# Patient Record
Sex: Female | Born: 1975 | Race: White | Hispanic: No | Marital: Single | Smoking: Current some day smoker
Health system: Southern US, Community
[De-identification: ages and names within clinical notes are randomized; demographics above are authoritative.]

## PROBLEM LIST (undated history)

## (undated) DIAGNOSIS — T7840XA Allergy, unspecified, initial encounter: Secondary | ICD-10-CM

## (undated) DIAGNOSIS — E785 Hyperlipidemia, unspecified: Secondary | ICD-10-CM

## (undated) DIAGNOSIS — F419 Anxiety disorder, unspecified: Secondary | ICD-10-CM

## (undated) DIAGNOSIS — G4730 Sleep apnea, unspecified: Secondary | ICD-10-CM

## (undated) DIAGNOSIS — I10 Essential (primary) hypertension: Secondary | ICD-10-CM

## (undated) DIAGNOSIS — D689 Coagulation defect, unspecified: Secondary | ICD-10-CM

## (undated) DIAGNOSIS — D649 Anemia, unspecified: Secondary | ICD-10-CM

## (undated) DIAGNOSIS — F329 Major depressive disorder, single episode, unspecified: Secondary | ICD-10-CM

## (undated) HISTORY — DX: Essential (primary) hypertension: I10

## (undated) HISTORY — DX: Anemia, unspecified: D64.9

## (undated) HISTORY — DX: Major depressive disorder, single episode, unspecified: F32.9

## (undated) HISTORY — DX: Allergy, unspecified, initial encounter: T78.40XA

## (undated) HISTORY — DX: Coagulation defect, unspecified: D68.9

## (undated) HISTORY — DX: Anxiety disorder, unspecified: F41.9

## (undated) HISTORY — PX: SPINAL FUSION: SHX223

## (undated) HISTORY — DX: Sleep apnea, unspecified: G47.30

## (undated) HISTORY — DX: Hyperlipidemia, unspecified: E78.5

---

## 2004-01-27 HISTORY — PX: CHOLECYSTECTOMY: SHX55

## 2006-09-19 ENCOUNTER — Ambulatory Visit: Payer: Self-pay | Admitting: Emergency Medicine

## 2007-03-17 ENCOUNTER — Observation Stay: Payer: Self-pay | Admitting: Obstetrics and Gynecology

## 2007-06-08 ENCOUNTER — Ambulatory Visit: Payer: Self-pay | Admitting: Internal Medicine

## 2007-06-08 ENCOUNTER — Emergency Department: Payer: Self-pay | Admitting: Emergency Medicine

## 2007-06-24 ENCOUNTER — Emergency Department: Payer: Self-pay | Admitting: Emergency Medicine

## 2007-10-09 ENCOUNTER — Ambulatory Visit: Payer: Self-pay | Admitting: Internal Medicine

## 2008-06-04 ENCOUNTER — Ambulatory Visit: Payer: Self-pay | Admitting: Internal Medicine

## 2011-03-20 DIAGNOSIS — F4310 Post-traumatic stress disorder, unspecified: Secondary | ICD-10-CM | POA: Insufficient documentation

## 2011-03-20 DIAGNOSIS — F1321 Sedative, hypnotic or anxiolytic dependence, in remission: Secondary | ICD-10-CM | POA: Insufficient documentation

## 2011-03-20 DIAGNOSIS — F1021 Alcohol dependence, in remission: Secondary | ICD-10-CM | POA: Insufficient documentation

## 2011-05-26 DIAGNOSIS — M5020 Other cervical disc displacement, unspecified cervical region: Secondary | ICD-10-CM | POA: Insufficient documentation

## 2011-11-18 DIAGNOSIS — N920 Excessive and frequent menstruation with regular cycle: Secondary | ICD-10-CM | POA: Insufficient documentation

## 2011-11-18 DIAGNOSIS — E663 Overweight: Secondary | ICD-10-CM | POA: Insufficient documentation

## 2011-11-18 DIAGNOSIS — F489 Nonpsychotic mental disorder, unspecified: Secondary | ICD-10-CM | POA: Insufficient documentation

## 2011-12-04 DIAGNOSIS — S9000XA Contusion of unspecified ankle, initial encounter: Secondary | ICD-10-CM | POA: Insufficient documentation

## 2012-06-30 DIAGNOSIS — N921 Excessive and frequent menstruation with irregular cycle: Secondary | ICD-10-CM | POA: Insufficient documentation

## 2012-06-30 DIAGNOSIS — N939 Abnormal uterine and vaginal bleeding, unspecified: Secondary | ICD-10-CM | POA: Insufficient documentation

## 2013-02-28 DIAGNOSIS — O3680X0 Pregnancy with inconclusive fetal viability, not applicable or unspecified: Secondary | ICD-10-CM | POA: Insufficient documentation

## 2013-03-03 DIAGNOSIS — O021 Missed abortion: Secondary | ICD-10-CM | POA: Insufficient documentation

## 2013-09-29 ENCOUNTER — Inpatient Hospital Stay: Payer: Self-pay

## 2013-09-29 LAB — CBC WITH DIFFERENTIAL/PLATELET
BASOS PCT: 0.8 %
Basophil #: 0.1 10*3/uL (ref 0.0–0.1)
EOS PCT: 2.8 %
Eosinophil #: 0.2 10*3/uL (ref 0.0–0.7)
HCT: 32.6 % — AB (ref 35.0–47.0)
HGB: 11.1 g/dL — AB (ref 12.0–16.0)
Lymphocyte #: 1.3 10*3/uL (ref 1.0–3.6)
Lymphocyte %: 17.2 %
MCH: 33 pg (ref 26.0–34.0)
MCHC: 34.1 g/dL (ref 32.0–36.0)
MCV: 97 fL (ref 80–100)
MONO ABS: 0.6 x10 3/mm (ref 0.2–0.9)
Monocyte %: 7.4 %
NEUTROS PCT: 71.8 %
Neutrophil #: 5.4 10*3/uL (ref 1.4–6.5)
PLATELETS: 230 10*3/uL (ref 150–440)
RBC: 3.36 10*6/uL — ABNORMAL LOW (ref 3.80–5.20)
RDW: 12.6 % (ref 11.5–14.5)
WBC: 7.5 10*3/uL (ref 3.6–11.0)

## 2013-09-29 LAB — BASIC METABOLIC PANEL
Anion Gap: 9 (ref 7–16)
BUN: 4 mg/dL — AB (ref 7–18)
CALCIUM: 8.7 mg/dL (ref 8.5–10.1)
CO2: 21 mmol/L (ref 21–32)
CREATININE: 0.4 mg/dL — AB (ref 0.60–1.30)
Chloride: 108 mmol/L — ABNORMAL HIGH (ref 98–107)
EGFR (African American): >60
EGFR (Non-African Amer.): >60
Glucose: 77 mg/dL (ref 65–99)
Osmolality: 271 (ref 275–301)
POTASSIUM: 3.4 mmol/L — AB (ref 3.5–5.1)
Sodium: 138 mmol/L (ref 136–145)

## 2013-10-27 ENCOUNTER — Ambulatory Visit: Payer: Self-pay | Admitting: Obstetrics & Gynecology

## 2014-01-19 ENCOUNTER — Observation Stay: Payer: Self-pay | Admitting: Obstetrics and Gynecology

## 2014-01-26 HISTORY — PX: CERVICAL CONE BIOPSY: SUR198

## 2014-01-30 ENCOUNTER — Inpatient Hospital Stay: Payer: Self-pay

## 2014-01-30 LAB — CBC WITH DIFFERENTIAL/PLATELET
Basophil #: 0.1 10*3/uL (ref 0.0–0.1)
Basophil %: 0.6 %
Eosinophil #: 0.2 10*3/uL (ref 0.0–0.7)
Eosinophil %: 2 %
HCT: 30.8 % — ABNORMAL LOW (ref 35.0–47.0)
HGB: 10.7 g/dL — ABNORMAL LOW (ref 12.0–16.0)
LYMPHS ABS: 1.6 10*3/uL (ref 1.0–3.6)
Lymphocyte %: 18.8 %
MCH: 32 pg (ref 26.0–34.0)
MCHC: 34.6 g/dL (ref 32.0–36.0)
MCV: 93 fL (ref 80–100)
MONO ABS: 0.7 x10 3/mm (ref 0.2–0.9)
MONOS PCT: 8.3 %
NEUTROS ABS: 6 10*3/uL (ref 1.4–6.5)
Neutrophil %: 70.3 %
PLATELETS: 250 10*3/uL (ref 150–440)
RBC: 3.33 10*6/uL — ABNORMAL LOW (ref 3.80–5.20)
RDW: 13.7 % (ref 11.5–14.5)
WBC: 8.5 10*3/uL (ref 3.6–11.0)

## 2014-01-31 LAB — HEMATOCRIT: HCT: 21.8 % — ABNORMAL LOW (ref 35.0–47.0)

## 2014-05-03 ENCOUNTER — Ambulatory Visit
Admit: 2014-05-03 | Disposition: A | Discharge status: Home / Self Care | Payer: Self-pay | Attending: Obstetrics and Gynecology | Admitting: Obstetrics and Gynecology

## 2014-05-21 LAB — SURGICAL PATHOLOGY

## 2014-05-27 NOTE — Op Note (Signed)
PATIENT NAME:  Lauren Cox, Lauren Cox MR#:  [id number] DATE OF BIRTH:  September 12, 1975  DATE OF PROCEDURE:  05/03/2014  PREOPERATIVE DIAGNOSIS:  Cervical intraepithelial neoplasia, grade 3.   POSTOPERATIVE DIAGNOSIS:  Cervical intraepithelial neoplasia, grade 3  OPERATION PERFORMED:  Cold knife cone biopsy of the cervix.  ANESTHESIA:  General.   PRIMARY SURGEON:  Florina OuAndreas M. Bonney AidStaebler, MD   PREOPERATIVE ANTIBIOTICS:  None.   DRAINS OR TUBES:  None. Surgicel dressing.   ESTIMATED BLOOD LOSS:  5 mL.   OPERATIVE FLUIDS:  500 mL.   URINE OUTPUT:  50 mL.   COMPLICATIONS:  None.   FINDINGS:  Grossly normal-appearing parous ectocervix. A stay suture of 0 Vicryl at the 3 and 9 o'clock position was placed during the case. Surgicel dressing was placed 12 O'Clock portion on specimen tagged with a suture.   CONDITION FOLLOWING PROCEDURE:  Stable.   PROCEDURE IN DETAIL:  Risks, benefits, and alternatives of the procedure were discussed with the patient prior to proceeding with the case. The patient was taken to the operating room and placed under general endotracheal anesthesia. She was positioned in the dorsal lithotomy position using allen stirups and prepped and drapped in the usual sterile fashion. A timeout was performed. The bladder was catheterized with a red rubber catheter. The anterior lip of the cervix was grasped with a single-tooth tenaculum. The cervix was deflected to the patient's left and the first stay suture of 0 Vicryl was placed in the 9 O'Clock position of the cervix. The cervix was then deflected to patients right and the second stay suture of 0 vicryl was placed at the 3 o'clock position. Both stay sutures were placed at the cervicovaginal junction. Following placement of the stay sutures, the cervix was infiltrated with 19 mL of 1% lidocaine with epinephrine. Following this the outline of the cone specimen was scored using and 11 blade scalpel.  A uterine sound was used to mark the direction  of the cervical canal, and the cone specimen was taken by cutting from the previously scorred margin toward the sound. Following removal of the cone specimen an ECC was performed to obtain the top margin of the biopsy. The bed was then cauterized using electrocautery. Monsel solution was applied and a Surgicel dressing was kept in place by tying the previously placed stay sutures together. The cervix was inspected and noted to be hemostatic. Sponge, needle, and instrument counts were correct x 2. The patient tolerated the procedure well and was taken to the recovery room in stable condition.     ____________________________ Florina OuAndreas M. Bonney AidStaebler, MD ams:nb D: 05/03/2014 21:38:25 ET T: 05/03/2014 22:30:16 ET JOB#: [id number]  cc: Florina OuAndreas M. Bonney AidStaebler, MD, <Dictator> Lorrene ReidANDREAS M Franciszek Platten MD ELECTRONICALLY SIGNED 05/16/2014 22:44
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
## 2015-04-11 DIAGNOSIS — B029 Zoster without complications: Secondary | ICD-10-CM | POA: Insufficient documentation

## 2016-05-18 ENCOUNTER — Ambulatory Visit: Payer: Self-pay | Admitting: Obstetrics and Gynecology

## 2016-05-25 ENCOUNTER — Ambulatory Visit (INDEPENDENT_AMBULATORY_CARE_PROVIDER_SITE_OTHER): Payer: Medicaid Other | Admitting: Obstetrics and Gynecology

## 2016-05-25 ENCOUNTER — Encounter: Payer: Self-pay | Admitting: Obstetrics and Gynecology

## 2016-05-25 VITALS — BP 138/80 | HR 82 | Ht 62.0 in | Wt 173.0 lb

## 2016-05-25 DIAGNOSIS — F411 Generalized anxiety disorder: Secondary | ICD-10-CM | POA: Diagnosis not present

## 2016-05-25 DIAGNOSIS — N946 Dysmenorrhea, unspecified: Secondary | ICD-10-CM

## 2016-05-25 DIAGNOSIS — N920 Excessive and frequent menstruation with regular cycle: Secondary | ICD-10-CM

## 2016-05-25 MED ORDER — NORETHINDRONE ACETATE 5 MG PO TABS: 5.0000 mg | ORAL_TABLET | Freq: Every day | ORAL | 11 refills | Status: DC

## 2016-05-25 MED ORDER — ESCITALOPRAM OXALATE 10 MG PO TABS: 10.0000 mg | ORAL_TABLET | Freq: Every day | ORAL | 2 refills | Status: DC

## 2016-05-25 MED ORDER — HYDROXYZINE HCL 25 MG PO TABS: 25.0000 mg | ORAL_TABLET | Freq: Four times a day (QID) | ORAL | 2 refills | Status: DC | PRN

## 2016-05-25 NOTE — Patient Instructions (Signed)
Generalized Anxiety Disorder, Adult Generalized anxiety disorder (GAD) is a mental health disorder. People with this condition constantly worry about everyday events. Unlike normal anxiety, worry related to GAD is not triggered by a specific event. These worries also do not fade or get better with time. GAD interferes with life functions, including relationships, work, and school. GAD can vary from mild to severe. People with severe GAD can have intense waves of anxiety with physical symptoms (panic attacks). What are the causes? The exact cause of GAD is not known. What increases the risk? This condition is more likely to develop in:  Women.  People who have a family history of anxiety disorders.  People who are very shy.  People who experience very stressful life events, such as the death of a loved one.  People who have a very stressful family environment. What are the signs or symptoms? People with GAD often worry excessively about many things in their lives, such as their health and family. They may also be overly concerned about:  Doing well at work.  Being on time.  Natural disasters.  Friendships. Physical symptoms of GAD include:  Fatigue.  Muscle tension or having muscle twitches.  Trembling or feeling shaky.  Being easily startled.  Feeling like your heart is pounding or racing.  Feeling out of breath or like you cannot take a deep breath.  Having trouble falling asleep or staying asleep.  Sweating.  Nausea, diarrhea, or irritable bowel syndrome (IBS).  Headaches.  Trouble concentrating or remembering facts.  Restlessness.  Irritability. How is this diagnosed? Your health care provider can diagnose GAD based on your symptoms and medical history. You will also have a physical exam. The health care provider will ask specific questions about your symptoms, including how severe they are, when they started, and if they come and go. Your health care  provider may ask you about your use of alcohol or drugs, including prescription medicines. Your health care provider may refer you to a mental health specialist for further evaluation. Your health care provider will do a thorough examination and may perform additional tests to rule out other possible causes of your symptoms. To be diagnosed with GAD, a person must have anxiety that:  Is out of his or her control.  Affects several different aspects of his or her life, such as work and relationships.  Causes distress that makes him or her unable to take part in normal activities.  Includes at least three physical symptoms of GAD, such as restlessness, fatigue, trouble concentrating, irritability, muscle tension, or sleep problems. Before your health care provider can confirm a diagnosis of GAD, these symptoms must be present more days than they are not, and they must last for six months or longer. How is this treated? The following therapies are usually used to treat GAD:  Medicine. Antidepressant medicine is usually prescribed for long-term daily control. Antianxiety medicines may be added in severe cases, especially when panic attacks occur.  Talk therapy (psychotherapy). Certain types of talk therapy can be helpful in treating GAD by providing support, education, and guidance. Options include:  Cognitive behavioral therapy (CBT). People learn coping skills and techniques to ease their anxiety. They learn to identify unrealistic or negative thoughts and behaviors and to replace them with positive ones.  Acceptance and commitment therapy (ACT). This treatment teaches people how to be mindful as a way to cope with unwanted thoughts and feelings.  Biofeedback. This process trains you to manage your body's response (  physiological response) through breathing techniques and relaxation methods. You will work with a therapist while machines are used to monitor your physical symptoms.  Stress  management techniques. These include yoga, meditation, and exercise. A mental health specialist can help determine which treatment is best for you. Some people see improvement with one type of therapy. However, other people require a combination of therapies. Follow these instructions at home:  Take over-the-counter and prescription medicines only as told by your health care provider.  Try to maintain a normal routine.  Try to anticipate stressful situations and allow extra time to manage them.  Practice any stress management or self-calming techniques as taught by your health care provider.  Do not punish yourself for setbacks or for not making progress.  Try to recognize your accomplishments, even if they are small.  Keep all follow-up visits as told by your health care provider. This is important. Contact a health care provider if:  Your symptoms do not get better.  Your symptoms get worse.  You have signs of depression, such as:  A persistently sad, cranky, or irritable mood.  Loss of enjoyment in activities that used to bring you joy.  Change in weight or eating.  Changes in sleeping habits.  Avoiding friends or family members.  Loss of energy for normal tasks.  Feelings of guilt or worthlessness. Get help right away if:  You have serious thoughts about hurting yourself or others. If you ever feel like you may hurt yourself or others, or have thoughts about taking your own life, get help right away. You can go to your nearest emergency department or call:  Your local emergency services (911 in the [place]  A suicide crisis helpline, such as the National Suicide Prevention Lifeline at 1-800-273-8255. This is open 24 hours a day. Summary  Generalized anxiety disorder (GAD) is a mental health disorder that involves worry that is not triggered by a specific event.  People with GAD often worry excessively about many things in their lives, such as their health and  family.  GAD may cause physical symptoms such as restlessness, trouble concentrating, sleep problems, frequent sweating, nausea, diarrhea, headaches, and trembling or muscle twitching.  A mental health specialist can help determine which treatment is best for you. Some people see improvement with one type of therapy. However, other people require a combination of therapies. This information is not intended to replace advice given to you by your health care provider. Make sure you discuss any questions you have with your health care provider. Document Released: 05/09/2012 Document Revised: 12/03/2015 Document Reviewed: 12/03/2015 Elsevier Interactive Patient Education  2017 Elsevier Inc.  

## 2016-05-25 NOTE — Progress Notes (Signed)
Obstetrics & Gynecology Office Visit   Chief Complaint:  Chief Complaint  Patient presents with  . Vaginal Bleeding    painful/ heavy bleeding/moody    History of Present Illness: The patient is a 41 y.o. female presenting initial evaluation for symptoms of anxiety.  The patient is currently taking prozac  for the management of her symptoms.  She has not had any recent situational stressors.  She reports symptoms of insomnia, irritability and social anxiety.  She denies anhedonia, day time somnolence, risk taking behavior, increased appetite, decreased appetite, agorophobia, feelings of guilt, feelings of worthlessness, suicidal ideation, homicidal ideation, auditory hallucinations and visual hallucinations. Symptoms have improved since intial symptom onset.     The patient does have a pre-existing history of depression and anxiety.  She  does not a prior history of suicide attempts.  Previous treatment tied include Prozac.     Review of Systems: Review of Systems  Psychiatric/Behavioral: Negative for depression, hallucinations, memory loss, substance abuse and suicidal ideas. The patient is nervous/anxious and has insomnia.     Past Medical History:  Past Medical History:  Diagnosis Date  . Anxiety     Past Surgical History:  Past Surgical History:  Procedure Laterality Date  . CERVICAL CONE BIOPSY  2016  . CHOLECYSTECTOMY  2006  . SPINAL FUSION      Gynecologic History: Patient's last menstrual period was 05/23/2016 (exact date).  Obstetric History: [id number]  Family History:  History reviewed. No pertinent family history.  Social History:  Social History   Social History  . Marital status: Married    Spouse name: N/A  . Number of children: N/A  . Years of education: N/A   Occupational History  . Not on file.   Social History Main Topics  . Smoking status: Current Every Day Smoker    Packs/day: 1.00    Types: Cigarettes  . Smokeless tobacco: Never  Used  . Alcohol use Yes  . Drug use: No  . Sexual activity: Yes    Partners: Male    Birth control/ protection: Pill   Other Topics Concern  . Not on file   Social History Narrative  . No narrative on file    Allergies:  Allergies  Allergen Reactions  . Sulfa Antibiotics Anaphylaxis  . Fish-Derived Products     Other reaction(s): SWELLING  . Penicillins Rash    Medications: Prior to Admission medications   Medication Sig Start Date End Date Taking? Authorizing Provider  EPINEPHrine 0.3 mg/0.3 mL IJ SOAJ injection Inject 0.3 mg into the muscle. 07/23/12  Yes Historical Provider, MD  FLUoxetine (PROZAC) 20 MG capsule Take 20 mg by mouth. 04/23/16 04/23/17 Yes Historical Provider, MD  levonorgestrel-ethinyl estradiol (AVIANE,ALESSE,LESSINA) 0.1-20 MG-MCG tablet Take one active pill daily, skip placebo pills, to suppress periods 01/01/16  Yes Historical Provider, MD    Physical Exam Vitals:  Vitals:   05/25/16 0919  BP: 138/80  Pulse: 82   Patient's last menstrual period was 05/23/2016 (exact date).  General: NAD HEENT: normocephalic, anicteric Pulmonary: No increased work of breathing Cardiovascular: RRR, distal pulses 2+ Abdomen: NABS, soft, non-tender, non-distended.  Umbilicus without lesions.  No hepatomegaly, splenomegaly or masses palpable. No evidence of hernia  Genitourinary:  External: Normal external female genitalia.  Normal urethral meatus, normal  Bartholin's and Skene's glands.    Vagina: Normal vaginal mucosa, no evidence of prolapse.    Cervix: Grossly normal in appearance, no bleeding  Uterus: Non-enlarged, mobile, normal contour.  No CMT  Adnexa: ovaries non-enlarged, no adnexal masses  Rectal: deferred  Lymphatic: no evidence of inguinal lymphadenopathy Extremities: no edema, erythema, or tenderness Neurologic: Grossly intact Psychiatric: mood appropriate, affect full  Female chaperone present for pelvic and breast  portions of the physical  exam  Assessment: 41 y.o. [id number] menorrhagia, dysmenorrhea, history of CKC for CIN II  Plan: Problem List Items Addressed This Visit    None     - switch to norethindrone differential includes adenomysosis and endometriosis.  Smoker - [facility] at [facility] and here previously normal - Pap up to date at [facility] lexapro and prn vistaril, discussed if maxed out on lexapro will add buspar.  GAD-7 is 19

## 2016-06-16 ENCOUNTER — Encounter: Payer: Self-pay | Admitting: Obstetrics and Gynecology

## 2016-08-21 ENCOUNTER — Ambulatory Visit: Payer: Medicaid Other | Admitting: Obstetrics and Gynecology

## 2016-09-14 ENCOUNTER — Emergency Department
Admission: EM | Admit: 2016-09-14 | Discharge: 2016-09-14 | Disposition: A | Discharge status: Home / Self Care | Payer: Self-pay | Attending: Emergency Medicine | Admitting: Emergency Medicine

## 2016-09-14 ENCOUNTER — Encounter: Payer: Self-pay | Admitting: Medical Oncology

## 2016-09-14 DIAGNOSIS — R112 Nausea with vomiting, unspecified: Secondary | ICD-10-CM

## 2016-09-14 DIAGNOSIS — S098XXA Other specified injuries of head, initial encounter: Secondary | ICD-10-CM | POA: Insufficient documentation

## 2016-09-14 DIAGNOSIS — Z79899 Other long term (current) drug therapy: Secondary | ICD-10-CM | POA: Insufficient documentation

## 2016-09-14 DIAGNOSIS — Y999 Unspecified external cause status: Secondary | ICD-10-CM | POA: Insufficient documentation

## 2016-09-14 DIAGNOSIS — W19XXXA Unspecified fall, initial encounter: Secondary | ICD-10-CM | POA: Insufficient documentation

## 2016-09-14 DIAGNOSIS — Y92511 Restaurant or cafe as the place of occurrence of the external cause: Secondary | ICD-10-CM | POA: Insufficient documentation

## 2016-09-14 DIAGNOSIS — E860 Dehydration: Secondary | ICD-10-CM | POA: Insufficient documentation

## 2016-09-14 DIAGNOSIS — F17210 Nicotine dependence, cigarettes, uncomplicated: Secondary | ICD-10-CM | POA: Insufficient documentation

## 2016-09-14 DIAGNOSIS — Y939 Activity, unspecified: Secondary | ICD-10-CM | POA: Insufficient documentation

## 2016-09-14 LAB — CBC
HCT: 43.6 % (ref 35.0–47.0)
HEMOGLOBIN: 15 g/dL (ref 12.0–16.0)
MCH: 32.9 pg (ref 26.0–34.0)
MCHC: 34.4 g/dL (ref 32.0–36.0)
MCV: 95.6 fL (ref 80.0–100.0)
PLATELETS: 295 10*3/uL (ref 150–440)
RBC: 4.56 MIL/uL (ref 3.80–5.20)
RDW: 12.2 % (ref 11.5–14.5)
WBC: 8.2 10*3/uL (ref 3.6–11.0)

## 2016-09-14 LAB — BASIC METABOLIC PANEL
Anion gap: 8 (ref 5–15)
BUN: 12 mg/dL (ref 6–20)
CALCIUM: 9.6 mg/dL (ref 8.9–10.3)
CO2: 27 mmol/L (ref 22–32)
Chloride: 105 mmol/L (ref 101–111)
Creatinine, Ser: 0.81 mg/dL (ref 0.44–1.00)
GLUCOSE: 87 mg/dL (ref 65–99)
Potassium: 3.8 mmol/L (ref 3.5–5.1)
Sodium: 140 mmol/L (ref 135–145)

## 2016-09-14 MED ORDER — SODIUM CHLORIDE 0.9 % IV BOLUS (SEPSIS)
1000.0000 mL | Freq: Once | INTRAVENOUS | Status: AC
  Administered 2016-09-14: 1000 mL via INTRAVENOUS

## 2016-09-14 MED ORDER — ONDANSETRON HCL 4 MG/2ML IJ SOLN
4.0000 mg | Freq: Once | INTRAMUSCULAR | Status: AC
  Administered 2016-09-14: 4 mg via INTRAVENOUS
  Filled 2016-09-14: qty 2

## 2016-09-14 MED ORDER — ONDANSETRON 4 MG PO TBDP: 4.0000 mg | ORAL_TABLET | Freq: Three times a day (TID) | ORAL | 0 refills | Status: DC | PRN

## 2016-09-14 NOTE — ED Triage Notes (Signed)
Pt reports she was at [employer] eating when she went outside and felt like she was going to pass out, pt reports she vomited x 1 then began to see white and fell to the ground. Pt states that she fell striking the rt side of her forehead. Pt appears very uncomfortable. Pt states pain around her entire rib cage that feels like a Belt is constricting her from breathing. Pt diaphoretic in triage. Dry blood noted to rt side of head.

## 2016-09-14 NOTE — ED Notes (Signed)
Patient states unable to void at this time. IVF running, will notify me when able. Skin warm and dry and pink.

## 2016-09-14 NOTE — ED Provider Notes (Signed)
[facility] Emergency Department Provider Note  ____________________________________________  Time seen: Approximately 7:55 PM  I have reviewed the triage vital signs and the nursing notes.   HISTORY  Chief Complaint Loss of Consciousness and Chest Pain    HPI Lauren Cox is a 41 y.o. female who is in her usual state of health, when she went to [employer] for lunch. 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 She went outside where she vomited once. She felt sweaty and lightheaded, and thinks that she fell onto the ground hitting her head. May have had brief loss of consciousness. At some point she called her significant other who arrived and found her sitting in the car. Patient reports that currently she just feels lightheaded when she sits up or stands. She does have a history of getting dehydrated and orthostatic, and reports that she has baseline anemia. She's been compliant with her medications.  Denies any chest pain or shortness of breath currently. No nausea or vomiting. Abdominal pain.     Past Medical History:  Diagnosis Date  . Anxiety      There are no active problems to display for this patient.    Past Surgical History:  Procedure Laterality Date  . CERVICAL CONE BIOPSY  2016  . CHOLECYSTECTOMY  2006  . SPINAL FUSION       Prior to Admission medications   Medication Sig Start Date End Date Taking? Authorizing Provider  EPINEPHrine 0.3 mg/0.3 mL IJ SOAJ injection Inject 0.3 mg into the muscle. 07/23/12   [provider]  escitalopram (LEXAPRO) 10 MG tablet Take 1 tablet (10 mg total) by mouth daily. 05/25/16 05/25/17  Vena Austria, MD  hydrOXYzine (ATARAX/VISTARIL) 25 MG tablet Take 1 tablet (25 mg total) by mouth every 6 (six) hours as needed for anxiety or  itching. 05/25/16   Vena Austria, MD  norethindrone (AYGESTIN) 5 MG tablet Take 1 tablet (5 mg total) by mouth daily. 05/25/16   Vena Austria, MD  ondansetron (ZOFRAN ODT) 4 MG disintegrating tablet Take 1 tablet (4 mg total) by mouth every 8 (eight) hours as needed for nausea or vomiting. 09/14/16   Sharman Cheek, MD     Allergies Sulfa antibiotics; Fish-derived products; and Penicillins   No family history on file.  Social History Social History  Substance Use Topics  . Smoking status: Current Every Day Smoker    Packs/day: 1.00    Types: Cigarettes  . Smokeless tobacco: Never Used  . Alcohol use Yes    Review of Systems  Constitutional:   No fever or chills.  ENT:   No sore throat. No rhinorrhea. Cardiovascular:   No chest pain or syncope. Respiratory:   No dyspnea or cough. Gastrointestinal:   Positive for brief episode of abdominal pain and vomiting. No diarrhea or constipation.  Musculoskeletal:   Negative for focal pain or swelling All other systems reviewed and are negative except as documented above in ROS and HPI.  ____________________________________________   PHYSICAL EXAM:  VITAL SIGNS: ED Triage Vitals  Enc Vitals Group     BP 09/14/16 1750 (!) 159/79     Pulse Rate 09/14/16 1750 98     Resp 09/14/16 1750 18     Temp 09/14/16 1750 98.4 F (36.9 C)     Temp Source 09/14/16 1750 Oral  SpO2 09/14/16 1750 100 %     Weight 09/14/16 1750 150 lb (68 kg)     Height 09/14/16 1750 5\' 1"  (1.549 m)     Head Circumference --      Peak Flow --      Pain Score 09/14/16 1749 4     Pain Loc --      Pain Edu? --      Excl. in GC? --     Vital signs reviewed, nursing assessments reviewed.   Constitutional:   Alert and oriented. Well appearing and in no distress. Eyes:   No scleral icterus.  EOMI. No nystagmus. No conjunctival pallor. PERRL. ENT   Head:   Normocephalic and atraumatic.   Nose:   No congestion/rhinnorhea.     Mouth/Throat:   Dry mucous membranes, no pharyngeal erythema. No peritonsillar mass.    Neck:   No meningismus. Full ROM Hematological/Lymphatic/Immunilogical:   No cervical lymphadenopathy. Cardiovascular:   RRR. Symmetric bilateral radial and DP pulses.  No murmurs.  Respiratory:   Normal respiratory effort without tachypnea/retractions. Breath sounds are clear and equal bilaterally. No wheezes/rales/rhonchi. Gastrointestinal:   Soft and nontender. Non distended. There is no CVA tenderness.  No rebound, rigidity, or guarding. Genitourinary:   deferred Musculoskeletal:   Normal range of motion in all extremities. No joint effusions.  No lower extremity tenderness.  No edema. Neurologic:   Normal speech and language.  Motor grossly intact. No gross focal neurologic deficits are appreciated.  Skin:    Skin is warm, dry and intact. No rash noted.  No petechiae, purpura, or bullae.  ____________________________________________    LABS (pertinent positives/negatives) (all labs ordered are listed, but only abnormal results are displayed) Labs Reviewed  BASIC METABOLIC PANEL  CBC  CBG MONITORING, ED   ____________________________________________   EKG  Interpreted by me Normal sinus rhythm rate of 79, rightward axis, normal intervals. Normal QRS ST segments and T waves. No evidence of right heart strain.  ____________________________________________    RADIOLOGY  No results found.  ____________________________________________   PROCEDURES Procedures  ____________________________________________   INITIAL IMPRESSION / ASSESSMENT AND PLAN / ED COURSE  Pertinent labs & imaging results that were available during my care of the patient were reviewed by me and considered in my medical decision making (see chart for details).  Patient well appearing no acute distress, presents with orthostatic symptoms, likely dehydrated versus food borne illness such as preformed staph  toxin from the meatloaf she ate at [employer] today. Also suffered a mild concussion related to her fall and head injury. Low suspicion for ACS PE dissection AAA ectopic torsion or surgical abdomen. Vital signs are normal, labs are unremarkable, exam is benign. Patient given IV fluids and Zofran, feeling better and tolerating oral intake. Discharge home to follow up with primary care. I think her hemoglobin of 15 today's hemoconcentration given her reported chronic anemia. No evidence of sepsis or acute infection.      ____________________________________________   FINAL CLINICAL IMPRESSION(S) / ED DIAGNOSES  Final diagnoses:  Dehydration  Nausea and vomiting, intractability of vomiting not specified, unspecified vomiting type  Blunt head trauma, initial encounter      New Prescriptions   ONDANSETRON (ZOFRAN ODT) 4 MG DISINTEGRATING TABLET    Take 1 tablet (4 mg total) by mouth every 8 (eight) hours as needed for nausea or vomiting.     Portions of this note were generated with dragon dictation software. Dictation errors may occur despite best attempts at  proofreading.    Sharman Cheek, MD 09/14/16 [phone]

## 2016-09-26 DIAGNOSIS — N912 Amenorrhea, unspecified: Secondary | ICD-10-CM | POA: Insufficient documentation

## 2016-12-09 ENCOUNTER — Telehealth: Payer: Self-pay | Admitting: Pharmacy Technician

## 2016-12-09 NOTE — Telephone Encounter (Signed)
[facility] filled initial prescription.  Patient was given new patient packet.  Patient never returned information or scheduled and eligibility appointment.  [facility] unable to provide additional medication assistance until eligibility is determined.  Lauren Cox Lauren Cox Care Manager [facility] 

## 2017-07-02 ENCOUNTER — Ambulatory Visit (INDEPENDENT_AMBULATORY_CARE_PROVIDER_SITE_OTHER): Payer: Self-pay | Admitting: Obstetrics and Gynecology

## 2017-07-02 ENCOUNTER — Encounter: Payer: Self-pay | Admitting: Obstetrics and Gynecology

## 2017-07-02 VITALS — HR 84 | Ht 61.0 in | Wt 155.0 lb

## 2017-07-02 DIAGNOSIS — Z1231 Encounter for screening mammogram for malignant neoplasm of breast: Secondary | ICD-10-CM

## 2017-07-02 DIAGNOSIS — Z113 Encounter for screening for infections with a predominantly sexual mode of transmission: Secondary | ICD-10-CM

## 2017-07-02 DIAGNOSIS — Z124 Encounter for screening for malignant neoplasm of cervix: Secondary | ICD-10-CM

## 2017-07-02 DIAGNOSIS — N882 Stricture and stenosis of cervix uteri: Secondary | ICD-10-CM

## 2017-07-02 DIAGNOSIS — Z1239 Encounter for other screening for malignant neoplasm of breast: Secondary | ICD-10-CM

## 2017-07-02 DIAGNOSIS — N939 Abnormal uterine and vaginal bleeding, unspecified: Secondary | ICD-10-CM

## 2017-07-02 MED ORDER — NORETHINDRONE ACETATE 5 MG PO TABS: 5.0000 mg | ORAL_TABLET | Freq: Every day | ORAL | 11 refills | Status: DC

## 2017-07-02 MED ORDER — CITALOPRAM HYDROBROMIDE 20 MG PO TABS: 20.0000 mg | ORAL_TABLET | Freq: Every day | ORAL | 11 refills | Status: DC

## 2017-07-02 NOTE — Progress Notes (Signed)
Gynecology Abnormal Uterine Bleeding Initial Evaluation   Chief Complaint:  Chief Complaint  Patient presents with  . Menstrual Problem    Ireegular bleeding, heavy periods, severe cramping     History of Present Illness:    [name] is a 42 y.o. [id number] who LMP was No LMP recorded. (Menstrual status: Irregular Periods)., presents today for a problem visit.  She complains of menorrhagia that  began several months ago and its severity is described as moderate.  She has irregular menses with duration up to 3 weeks with onset, moderate menstrual cramping.  She has used the following for attempts at control: none currently but previously well controlled on norethindrone. The patient is not currently sexually active.   Prior Diagnosis: AUB-O secondary to PCOS. Previous Treatment: norethindrone.  She does have a history of cervical dysplasia but no follow up pap smears since the procedure for CIN III.  Review of Systems: Review of Systems  Constitutional: Negative for chills and fever.  HENT: Negative for congestion.   Respiratory: Negative for cough and shortness of breath.   Cardiovascular: Negative for chest pain and palpitations.  Gastrointestinal: Negative for abdominal pain, constipation, diarrhea, heartburn, nausea and vomiting.  Genitourinary: Negative for dysuria, frequency and urgency.  Skin: Negative for itching and rash.  Neurological: Negative for dizziness and headaches.  Endo/Heme/Allergies: Negative for polydipsia.  Psychiatric/Behavioral: Negative for depression.    Past Medical History:  Past Medical History:  Diagnosis Date  . Anxiety     Past Surgical History:  Past Surgical History:  Procedure Laterality Date  . CERVICAL CONE BIOPSY  2016  . CHOLECYSTECTOMY  2006  . SPINAL FUSION      Obstetric History: [id number]  Family History:  History reviewed. No pertinent family history.  Social History:  Social History   Socioeconomic History  . Marital  status: Divorced    Spouse name: Not on file  . Number of children: Not on file  . Years of education: Not on file  . Highest education level: Not on file  Occupational History  . Not on file  Social Needs  . Financial resource strain: Not on file  . Food insecurity:    Worry: Not on file    Inability: Not on file  . Transportation needs:    Medical: Not on file    Non-medical: Not on file  Tobacco Use  . Smoking status: Current Every Day Smoker    Packs/day: 1.00    Types: Cigarettes  . Smokeless tobacco: Never Used  Substance and Sexual Activity  . Alcohol use: Yes  . Drug use: No  . Sexual activity: Yes    Partners: Male    Birth control/protection: None  Lifestyle  . Physical activity:    Days per week: Not on file    Minutes per session: Not on file  . Stress: Not on file  Relationships  . Social connections:    Talks on phone: Not on file    Gets together: Not on file    Attends religious service: Not on file    Active member of club or organization: Not on file    Attends meetings of clubs or organizations: Not on file    Relationship status: Not on file  . Intimate partner violence:    Fear of current or ex partner: Not on file    Emotionally abused: Not on file    Physically abused: Not on file    Forced sexual activity: Not on  file  Other Topics Concern  . Not on file  Social History Narrative  . Not on file    Allergies:  Allergies  Allergen Reactions  . Sulfa Antibiotics Anaphylaxis  . Fish-Derived Products     Other reaction(s): SWELLING  . Penicillins Rash    Medications: Prior to Admission medications   Medication Sig Start Date End Date Taking? Authorizing Provider  Multiple Vitamin (MULTIVITAMIN) capsule Take 1 capsule by mouth daily.   Yes [provider]  EPINEPHrine 0.3 mg/0.3 mL IJ SOAJ injection Inject 0.3 mg into the muscle. 07/23/12   [provider]  escitalopram (LEXAPRO) 10 MG tablet Take 1 tablet (10 mg  total) by mouth daily. 05/25/16 05/25/17  Vena Austria, MD  hydrOXYzine (ATARAX/VISTARIL) 25 MG tablet Take 1 tablet (25 mg total) by mouth every 6 (six) hours as needed for anxiety or itching. Patient not taking: Reported on 07/02/2017 05/25/16   Vena Austria, MD  norethindrone (AYGESTIN) 5 MG tablet Take 1 tablet (5 mg total) by mouth daily. Patient not taking: Reported on 07/02/2017 05/25/16   Vena Austria, MD    Physical Exam Pulse 84, height 5\' 1"  (1.549 m), weight 155 lb (70.3 kg).  No LMP recorded. (Menstrual status: Irregular Periods).  General: NAD HEENT: normocephalic, anicteric Pulmonary: No increased work of breathing Genitourinary:  External: Normal external female genitalia.  Normal urethral meatus, normal Bartholin's and Skene's glands.    Vagina: Normal vaginal mucosa, no evidence of prolapse.    Cervix: Grossly normal in appearance, no bleeding  Uterus: Non-enlarged, mobile, normal contour.  No CMT  Adnexa: ovaries non-enlarged, no adnexal masses  Rectal: deferred  Lymphatic: no evidence of inguinal lymphadenopathy Extremities: no edema, erythema, or tenderness Neurologic: Grossly intact Psychiatric: mood appropriate, affect full  Female chaperone present for pelvic portions of the physical exam    ENDOMETRIAL BIOPSY     The indications for endometrial biopsy were reviewed.   Risks of the biopsy including cramping, bleeding, infection, uterine perforation, inadequate specimen and need for additional procedures  were discussed. The patient states she understands and agrees to undergo procedure today. Consent was signed. Time out was performed. Urine HCG was negative. A Graves speculum was placed and the cervix was brought into view.  The cervix was prepped with Betadine. A single-toothed tenaculum was  placed on the anterior lip of the cervix for traction. A 3 mm pipelle was introduced through the cervix but unable to pass into the endometrial cavity likely  secondary to cervical scarring and stenosis from her prior LEEP. The instruments were removed from the patient's vagina. Minimal bleeding from the cervix was noted. The patient tolerated the procedure well. Routine post-procedure instructions were given to the patient.    Assessment: 42 y.o. [id number] with abnormal uterine bleeding  Plan: Problem List Items Addressed This Visit    None    Visit Diagnoses    Abnormal uterine bleeding    -  Primary   Relevant Orders   PapIG, HPV, rfx 16/18   US Transvaginal Non-OB   Screening for malignant neoplasm of cervix       Relevant Orders   PapIG, HPV, rfx 16/18   Routine screening for STI (sexually transmitted infection)       Breast screening       Relevant Orders   MM DIGITAL SCREENING BILATERAL   Cervical stenosis (uterine cervix)          1) Discussed management options for abnormal uterine bleeding including expectant, NSAIDs,  tranexamic acid (Lysteda), oral progesterone (Provera, norethindrone, megace), Depo Provera, Levonorgestrel containing IUD, endometrial ablation (Novasure) or hysterectomy as definitive surgical management.  Discussed risks and benefits of each method.   Final management decision will hinge on results of patient's work up and whether an underlying etiology for the patients bleeding symptoms can be discerned.  We will conduct a basic work up examining using the PALM-COIEN classification system.  In the meantime the patient opts to trial norethindrone while we await results of her ultrasound and labs.  The role of unopposed estrogen in the development of endometrial hyperplasia or carcinoma is discussed.  The risk of endometrial hyperplasia is linearly correlated with increasing BMI given the production of estrone by adipose tissue. Printed patient education handouts were given to the patient to review at home.  Bleeding precautions reviewed.  - pap and endometrial biopsy  Unable to obtain endometrial biopsy secondary to  cervical stenosis - mammogram ordered - Restart norethindrone refill lexparo (self pay switch to citalopram) - ultrasound as unable to obtained EMBx  2) Return in about 2 weeks (around 07/16/2017) for US and follow up.  Vena AustriaAndreas Nivin Braniff, MD, Evern CoreFACOG [facility], [facility] 07/02/2017, 8:42 AM

## 2017-07-02 NOTE — Patient Instructions (Signed)
Behavior Health Resources   [facility] cash pay discounts  [facility] samples at walk in clinic

## 2017-07-07 LAB — PAPIG, HPV, RFX 16/18
HPV, high-risk: NEGATIVE
PAP Smear Comment: 0

## 2017-07-13 ENCOUNTER — Telehealth: Payer: Self-pay

## 2017-07-13 ENCOUNTER — Telehealth: Payer: Self-pay | Admitting: Obstetrics and Gynecology

## 2017-07-13 NOTE — Telephone Encounter (Signed)
Pt would like clarification of her pap results.  [phone]

## 2017-07-13 NOTE — Telephone Encounter (Signed)
Called and spoke with Patient about scheduling Colpo. Patient would like to  Come in for Ultrasound first before scheduling Colpo. Patient is self pay and [facility]

## 2017-07-13 NOTE — Telephone Encounter (Signed)
Pap results are: ASCUS/Neg HPV. Please advise patient

## 2017-07-13 NOTE — Telephone Encounter (Signed)
-----   Message from Vena AustriaAndreas Staebler, MD sent at 07/13/2017 12:06 PM EDT ----- Should have ultrasound scheduled in the next week but can we also add on a colposcopy given her pap results

## 2017-07-17 ENCOUNTER — Other Ambulatory Visit: Payer: Self-pay | Admitting: Obstetrics and Gynecology

## 2017-07-17 DIAGNOSIS — D061 Carcinoma in situ of exocervix: Secondary | ICD-10-CM

## 2017-07-17 DIAGNOSIS — R87610 Atypical squamous cells of undetermined significance on cytologic smear of cervix (ASC-US): Secondary | ICD-10-CM

## 2017-07-20 ENCOUNTER — Ambulatory Visit: Payer: Self-pay | Admitting: Obstetrics and Gynecology

## 2017-07-20 ENCOUNTER — Encounter: Payer: Self-pay | Admitting: Obstetrics and Gynecology

## 2017-07-20 ENCOUNTER — Ambulatory Visit (INDEPENDENT_AMBULATORY_CARE_PROVIDER_SITE_OTHER): Payer: Self-pay

## 2017-07-20 VITALS — BP 132/78 | HR 78 | Ht 62.0 in | Wt 156.0 lb

## 2017-07-20 DIAGNOSIS — N939 Abnormal uterine and vaginal bleeding, unspecified: Secondary | ICD-10-CM

## 2017-07-20 DIAGNOSIS — D069 Carcinoma in situ of cervix, unspecified: Secondary | ICD-10-CM

## 2017-07-20 DIAGNOSIS — N946 Dysmenorrhea, unspecified: Secondary | ICD-10-CM

## 2017-07-20 DIAGNOSIS — R87610 Atypical squamous cells of undetermined significance on cytologic smear of cervix (ASC-US): Secondary | ICD-10-CM

## 2017-07-20 NOTE — Progress Notes (Signed)
Gynecology Ultrasound Follow Up  Chief Complaint:  Chief Complaint  Patient presents with  . Follow-up    GYN U/S     History of Present Illness: Patient is a 42 y.o. female who presents today for ultrasound evaluation of abnormal uterine bleeding and dysmenorrhea.  Ultrasound demonstrates the following findgins Adnexanormal ovaries Uterus: Non-enlarged with endometrial stripe suspicious for endometriosis Additional: no free fluid.  Seward CarolSunjay Clarita LeberWomble is a 42 y.o. woman who presents today for continued surveillance for history of dysplasia. Last pap obtained on 07/02/2017 revealed ASCUS HPV negative.  Prior CKC CIN III  05/03/2014  Review of Systems: ROS  Past Medical History:  Past Medical History:  Diagnosis Date  . Anxiety     Past Surgical History:  Past Surgical History:  Procedure Laterality Date  . CERVICAL CONE BIOPSY  2016  . CHOLECYSTECTOMY  2006  . SPINAL FUSION      Gynecologic History:  No LMP recorded. (Menstrual status: Irregular Periods).  Family History:  No family history on file.  Social History:  Social History   Socioeconomic History  . Marital status: Divorced    Spouse name: Not on file  . Number of children: Not on file  . Years of education: Not on file  . Highest education level: Not on file  Occupational History  . Not on file  Social Needs  . Financial resource strain: Not on file  . Food insecurity:    Worry: Not on file    Inability: Not on file  . Transportation needs:    Medical: Not on file    Non-medical: Not on file  Tobacco Use  . Smoking status: Current Every Day Smoker    Packs/day: 1.00    Types: Cigarettes  . Smokeless tobacco: Never Used  Substance and Sexual Activity  . Alcohol use: Yes  . Drug use: No  . Sexual activity: Yes    Partners: Male    Birth control/protection: None  Lifestyle  . Physical activity:    Days per week: Not on file    Minutes per session: Not on file  . Stress: Not on file    Relationships  . Social connections:    Talks on phone: Not on file    Gets together: Not on file    Attends religious service: Not on file    Active member of club or organization: Not on file    Attends meetings of clubs or organizations: Not on file    Relationship status: Not on file  . Intimate partner violence:    Fear of current or ex partner: Not on file    Emotionally abused: Not on file    Physically abused: Not on file    Forced sexual activity: Not on file  Other Topics Concern  . Not on file  Social History Narrative  . Not on file    Allergies:  Allergies  Allergen Reactions  . Sulfa Antibiotics Anaphylaxis  . Fish-Derived Products     Other reaction(s): SWELLING  . Penicillins Rash    Medications: Prior to Admission medications   Medication Sig Start Date End Date Taking? Authorizing Provider  citalopram (CELEXA) 20 MG tablet Take 1 tablet (20 mg total) by mouth daily. 07/02/17   Vena AustriaStaebler, Jannine Abreu, MD  EPINEPHrine 0.3 mg/0.3 mL IJ SOAJ injection Inject 0.3 mg into the muscle. 07/23/12   [provider]  hydrOXYzine (ATARAX/VISTARIL) 25 MG tablet Take 1 tablet (25 mg total) by mouth every 6 (six)  hours as needed for anxiety or itching. Patient not taking: Reported on 07/02/2017 05/25/16   Vena Austria, MD  Multiple Vitamin (MULTIVITAMIN) capsule Take 1 capsule by mouth daily.    [provider]  norethindrone (AYGESTIN) 5 MG tablet Take 1 tablet (5 mg total) by mouth daily. 07/02/17   Vena Austria, MD    Physical Exam Vitals: There were no vitals taken for this visit.  General: NAD HEENT: normocephalic, anicteric Pulmonary: No increased work of breathing Extremities: no edema, erythema, or tenderness Neurologic: Grossly intact, normal gait Psychiatric: mood appropriate, affect full    Assessment: 42 y.o. [id number] No problem-specific Assessment & Plan notes found for this encounter.   Plan: Problem List Items Addressed This  Visit    None    Visit Diagnoses    ASCUS of cervix with negative high risk HPV    -  Primary   CIN III with severe dysplasia       Abnormal uterine bleeding       Dysmenorrhea          1) Follow up pap following CKC for CIN III - ASCUS HPV negative.  Per ASCCP guidelines I would obtain a colposcopy but as lapse in insurance has been referred to [facility] with appointment pending  2) AUB and dysmenorrhea - ultrasound today consistent with adenomyosis.  I think symptoms are further compounded by partial outlet obstruction secondary to cervical stenosis following CKC.  I discussed cervical dilation and endometrial sampling in OR with [facility].  Will await results of [facility] referral to verify no additional cervical procedures are warranted concomitantly. - continue norethindrone for cycle suppression has done well with this in the past for presumptive endometriosis and dysmenorrhea  3) A total of 15 minutes were spent in face-to-face contact with the patient during this encounter with over half of that time devoted to counseling and coordination of care.    Vena Austria, MD, [facility], [facility]

## 2017-10-06 ENCOUNTER — Telehealth: Payer: Self-pay | Admitting: Obstetrics and Gynecology

## 2017-10-06 NOTE — Telephone Encounter (Signed)
Patient for results. Please advise best to reach patient after 11am tomorrow 10/07/17. Thank you

## 2017-10-14 ENCOUNTER — Telehealth: Payer: Self-pay | Admitting: *Deleted

## 2017-11-01 NOTE — Telephone Encounter (Signed)
Have tried several time to reach patient can we maybe set up follow up if she calls back

## 2017-11-03 NOTE — Telephone Encounter (Signed)
Called and left voice mail for patient to call back to be schedule °

## 2017-11-04 NOTE — Telephone Encounter (Signed)
Called and left voice mail for patient to call back to be schedule °

## 2018-02-26 HISTORY — PX: OPTIC NERVE SHEATH FENESTRATIAN: SHX2109

## 2018-05-30 ENCOUNTER — Other Ambulatory Visit: Payer: Self-pay

## 2018-05-30 ENCOUNTER — Emergency Department (HOSPITAL_COMMUNITY)
Admission: EM | Admit: 2018-05-30 | Discharge: 2018-05-31 | Disposition: A | Discharge status: Home / Self Care | Payer: Self-pay | Attending: Emergency Medicine | Admitting: Emergency Medicine

## 2018-05-30 ENCOUNTER — Emergency Department (HOSPITAL_COMMUNITY): Payer: Self-pay

## 2018-05-30 ENCOUNTER — Encounter (HOSPITAL_COMMUNITY): Payer: Self-pay | Admitting: Emergency Medicine

## 2018-05-30 DIAGNOSIS — R079 Chest pain, unspecified: Secondary | ICD-10-CM | POA: Insufficient documentation

## 2018-05-30 DIAGNOSIS — Z5321 Procedure and treatment not carried out due to patient leaving prior to being seen by health care provider: Secondary | ICD-10-CM | POA: Insufficient documentation

## 2018-05-30 LAB — TROPONIN I: Troponin I: <0.03 ng/mL (ref <0.03)

## 2018-05-30 LAB — CBC
HCT: 39.7 % (ref 36.0–46.0)
Hemoglobin: 13.7 g/dL (ref 12.0–15.0)
MCH: 33.3 pg (ref 26.0–34.0)
MCHC: 34.5 g/dL (ref 30.0–36.0)
MCV: 96.6 fL (ref 80.0–100.0)
Platelets: 312 10*3/uL (ref 150–400)
RBC: 4.11 MIL/uL (ref 3.87–5.11)
RDW: 11.9 % (ref 11.5–15.5)
WBC: 7.2 10*3/uL (ref 4.0–10.5)
nRBC: 0 % (ref 0.0–0.2)

## 2018-05-30 LAB — BASIC METABOLIC PANEL
Anion gap: 9 (ref 5–15)
BUN: 11 mg/dL (ref 6–20)
CO2: 24 mmol/L (ref 22–32)
Calcium: 8.6 mg/dL — ABNORMAL LOW (ref 8.9–10.3)
Chloride: 104 mmol/L (ref 98–111)
Creatinine, Ser: 0.67 mg/dL (ref 0.44–1.00)
GFR calc Af Amer: >60 mL/min (ref >60)
GFR calc non Af Amer: >60 mL/min (ref >60)
Glucose, Bld: 105 mg/dL — ABNORMAL HIGH (ref 70–99)
Potassium: 4.3 mmol/L (ref 3.5–5.1)
Sodium: 137 mmol/L (ref 135–145)

## 2018-05-30 LAB — I-STAT BETA HCG BLOOD, ED (MC, WL, AP ONLY): I-stat hCG, quantitative: <5 m[IU]/mL (ref <5)

## 2018-05-30 MED ORDER — SODIUM CHLORIDE 0.9% FLUSH: 3.0000 mL | Freq: Once | INTRAVENOUS | Status: DC

## 2018-05-30 NOTE — ED Triage Notes (Signed)
While having intercourse 2 hours ago pt had intense central CP x 1 minute, felt dizzy, pt then took a nap, when she woke up she felt like she was having an anxiety attack and couldn't get a deep breath, threw up twice.

## 2018-09-20 ENCOUNTER — Ambulatory Visit
Admission: RE | Admit: 2018-09-20 | Discharge: 2018-09-20 | Disposition: A | Discharge status: Home / Self Care | Payer: Self-pay | Source: Ambulatory Visit | Attending: Oncology | Admitting: Oncology

## 2018-09-20 ENCOUNTER — Other Ambulatory Visit (HOSPITAL_COMMUNITY)
Admission: RE | Admit: 2018-09-20 | Discharge: 2018-09-20 | Disposition: A | Discharge status: Home / Self Care | Payer: Self-pay | Source: Ambulatory Visit | Attending: Obstetrics and Gynecology | Admitting: Obstetrics and Gynecology

## 2018-09-20 ENCOUNTER — Ambulatory Visit (INDEPENDENT_AMBULATORY_CARE_PROVIDER_SITE_OTHER): Payer: Self-pay | Admitting: Obstetrics and Gynecology

## 2018-09-20 ENCOUNTER — Other Ambulatory Visit: Payer: Self-pay

## 2018-09-20 ENCOUNTER — Encounter: Payer: Self-pay | Admitting: Obstetrics and Gynecology

## 2018-09-20 ENCOUNTER — Ambulatory Visit: Payer: Self-pay | Attending: Oncology

## 2018-09-20 VITALS — BP 132/64 | HR 102 | Wt 209.0 lb

## 2018-09-20 VITALS — BP 153/86 | HR 70 | Temp 96.7°F | Ht 63.5 in | Wt 208.7 lb

## 2018-09-20 DIAGNOSIS — Z124 Encounter for screening for malignant neoplasm of cervix: Secondary | ICD-10-CM | POA: Insufficient documentation

## 2018-09-20 DIAGNOSIS — Z0000 Encounter for general adult medical examination without abnormal findings: Secondary | ICD-10-CM | POA: Insufficient documentation

## 2018-09-20 DIAGNOSIS — N939 Abnormal uterine and vaginal bleeding, unspecified: Secondary | ICD-10-CM

## 2018-09-20 NOTE — Progress Notes (Signed)
  Subjective:     Patient ID: Lauren Cox, female   DOB: April 10, 1975, 43 y.o.   MRN: [id number]  HPI   Review of Systems     Objective:   Physical Exam Chest:     Breasts:        Right: No swelling, bleeding, inverted nipple, mass, nipple discharge, skin change or tenderness.        Left: No swelling, bleeding, inverted nipple, mass, nipple discharge, skin change or tenderness.     Comments: Large pendulous breasts       Assessment:     43 year old patient presents for [place] clinic visit.  Referred by Dr. Georgianne Fick due to history of CINII  No prior mammogram.  Patient screened, and meets [facility] eligibility.  Patient does not have insurance, Medicare or Medicaid. Instructed patient on breast self awareness using teach back method. Clinical breast exam unremarkable. PAtient anxious and sometimes tearful.  States she thought she was just coming for colposocpy, not mammogram.  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 More relaxed when leaving.     Plan:     Sent for bilateral baseline screening mammogram.  Appointment with Dr. Georgianne Fick this afternoon.

## 2018-09-20 NOTE — Progress Notes (Signed)
Obstetrics & Gynecology Office Visit   Chief Complaint:  Chief Complaint  Patient presents with  . Colposcopy    History of Present Illness:Lauren Cox is a 43 y.o. woman who presents today for continued surveillance for history of dysplasia. Last pap obtained on 07/02/2017 revealed ASCUS HPV positive.  The patient was triaged to colposcopy as she never followed up for her 1 year CKC follow up pap from 05/03/2014.  She now presents one year later.    She has recently been worked up for pseudotumor cerebri.  Stopped her po progestin.  Menses heavy lasingt up to two week since stopping prometrium.    Pap/Treatment History:  Pap 07/02/2017 revealed ASCUS HPV negative.   CKC CIN III  05/03/2014 with negative margins  Review of Systems: Review of Systems  Constitutional: Negative.   Gastrointestinal: Negative.   Genitourinary: Negative.    Past Medical History:  Past Medical History:  Diagnosis Date  . Anxiety     Past Surgical History:  Past Surgical History:  Procedure Laterality Date  . CERVICAL CONE BIOPSY  2016  . CHOLECYSTECTOMY  2006  . OPTIC NERVE SHEATH FENESTRATIAN  02/2018  . SPINAL FUSION      Gynecologic History: Patient's last menstrual period was 09/18/2018 (exact date).  Obstetric History: [id number]  Family History:  History reviewed. No pertinent family history.  Social History:  Social History   Socioeconomic History  . Marital status: Divorced    Spouse name: Not on file  . Number of children: Not on file  . Years of education: Not on file  . Highest education level: Not on file  Occupational History  . Not on file  Social Needs  . Financial resource strain: Not on file  . Food insecurity    Worry: Not on file    Inability: Not on file  . Transportation needs    Medical: Not on file    Non-medical: Not on file  Tobacco Use  . Smoking status: Current Some Day Smoker    Packs/day: 0.50    Types: Cigarettes  . Smokeless tobacco:  Never Used  Substance and Sexual Activity  . Alcohol use: Yes    Comment: occ  . Drug use: No  . Sexual activity: Yes    Partners: Male    Birth control/protection: None  Lifestyle  . Physical activity    Days per week: Not on file    Minutes per session: Not on file  . Stress: Not on file  Relationships  . Social Musician on phone: Not on file    Gets together: Not on file    Attends religious service: Not on file    Active member of club or organization: Not on file    Attends meetings of clubs or organizations: Not on file    Relationship status: Not on file  . Intimate partner violence    Fear of current or ex partner: Not on file    Emotionally abused: Not on file    Physically abused: Not on file    Forced sexual activity: Not on file  Other Topics Concern  . Not on file  Social History Narrative  . Not on file    Allergies:  Allergies  Allergen Reactions  . Sulfa Antibiotics Anaphylaxis  . Fish-Derived Products     Other reaction(s): SWELLING  . Penicillins Rash    Medications: Prior to Admission medications   Medication Sig Start Date End  Date Taking? Authorizing Provider  acetaZOLAMIDE (DIAMOX) 500 MG capsule Take 500 mg by mouth 2 (two) times daily.   Yes [provider]  EPINEPHrine 0.3 mg/0.3 mL IJ SOAJ injection Inject 0.3 mg into the muscle. 07/23/12  Yes [provider]  citalopram (CELEXA) 20 MG tablet Take 1 tablet (20 mg total) by mouth daily. Patient not taking: Reported on 09/20/2018 07/02/17   Vena AustriaStaebler, Airon Sahni, MD  Multiple Vitamin (MULTIVITAMIN) capsule Take 1 capsule by mouth daily.    [provider]  norethindrone (AYGESTIN) 5 MG tablet Take 1 tablet (5 mg total) by mouth daily. Patient not taking: Reported on 09/20/2018 07/02/17   Vena AustriaStaebler, Roda Lauture, MD    Physical Exam Vitals:  Vitals:   09/20/18 1356  BP: 132/64  Pulse: (!) 102   Patient's last menstrual period was 09/18/2018 (exact date).   General: NAD HEENT: normocephalic, anicteric Thyroid: no enlargement, no palpable nodules Pulmonary: No increased work of breathing Genitourinary:  External: Normal external female genitalia.  Normal urethral meatus, normal  Bartholin's and Skene's glands.    Vagina: Normal vaginal mucosa, no evidence of prolapse.    Cervix: Grossly normal in appearance, no bleeding Extremities: no edema, erythema, or tenderness Neurologic: Grossly intact Psychiatric: mood appropriate, affect full  Female chaperone present for pelvic and breast  portions of the physical exam    GYNECOLOGY CLINIC COLPOSCOPY PROCEDURE NOTE  43 y.o. [id number] here for colposcopy for ASCUS with NEGATIVE high risk HPV  pap smear on 07/2017. Discussed underlying role for HPV infection in the development of cervical dysplasia, its natural history and progression/regression, need for surveillance.  Is the patient  pregnant: No LMP: Patient's last menstrual period was 09/18/2018 (exact date). Smoking status:  reports that she has been smoking cigarettes. She has been smoking about 0.50 packs per day. She has never used smokeless tobacco. Contraception: none  Patient given informed consent, signed copy in the chart, time out was performed.  The patient was position in dorsal lithotomy position. Speculum was placed the cervix was visualized.   After application of acetic acid colposcopic inspection of the cervix was undertaken.   Colposcopy adequate, full visualization of transformation zone: Yes given prior CKC a small Q-tip was used to facilitate looking within the cervical os no visible lesions; corresponding biopsies obtained.   ECC specimen obtained:  Yes  All specimens were labeled and sent to pathology.   Patient was given post procedure instructions.  Will follow up pathology and manage accordingly.  Routine preventative health maintenance measures emphasized.  OBGyn Exam  Vena AustriaAndreas Teralyn Mullins, MD, FACOG [facility],  [facility]     Assessment: 43 y.o. [phone] follow up for ASCUS HPV negative pap but in the setting of prior CKC showing CIN III and failure to obtain two negative results following procedure  Plan: Problem List Items Addressed This Visit    None    Visit Diagnoses    Abnormal uterine bleeding    -  Primary   Relevant Orders   US Transvaginal Non-OB   TSH+Prl+FSH+TestT+LH+DHEA S...   Screening for malignant neoplasm of cervix       Relevant Orders   Surgical pathology   Cytology - PAP      - Follow up pap smear from today. Also biopsy given did not complete colposcopy last year.    - I had a lengthly discussion with Newman NipSunjay Shrieves  regarding the cause of dysplasia of the lower genital tract (including immunosuppression in the setting of  HPV exposure and tobacco exposure). I explained the potential for progression to invasive malignancy, the recurrent nature of these lesions (and the need for close continued followup). Results of today's pap will dictate need for further evaluation and follow up per ASCCP guidelines..  - She is comfortable with the plan and had her questions answered.  - AUB - will obtain PCOS panel labs and TVUS to rule out strucrual lesions.  Patient reports prior CT head, is scheduled for MRI.  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  This may cause menstrual abnormalities secondary to the change in pituitary hormones.  - Return in about 1 week (around 09/27/2018) for GYN follow up and TVUS.   Malachy Mood, MD, [facility], [facility] 09/20/2018, 2:35 PM

## 2018-09-23 LAB — CYTOLOGY - PAP
Diagnosis: NEGATIVE
HPV: NOT DETECTED

## 2018-09-27 ENCOUNTER — Other Ambulatory Visit: Payer: Self-pay | Admitting: Obstetrics and Gynecology

## 2018-09-27 DIAGNOSIS — A5901 Trichomonal vulvovaginitis: Secondary | ICD-10-CM

## 2018-09-27 MED ORDER — METRONIDAZOLE 500 MG PO TABS: 2000.0000 mg | ORAL_TABLET | Freq: Once | ORAL | 0 refills | Status: AC

## 2018-10-04 ENCOUNTER — Other Ambulatory Visit: Payer: Self-pay

## 2018-10-04 ENCOUNTER — Ambulatory Visit: Payer: Self-pay | Admitting: Obstetrics and Gynecology

## 2018-10-17 NOTE — Progress Notes (Signed)
Patient ID: Lauren Cox, female   DOB: 06/25/1975, 43 y.o.   MRN: [id number] Dr. Lorenza Evangelist notified patient of cervical biopsy results, and treated trichomonas.  Patient camcelled her follow-up with him. Letter mailed from [facility] to notify of normal mammogram results.  Patient to return in one year for annual screening.  Copy to [facility].

## 2019-05-10 ENCOUNTER — Other Ambulatory Visit: Payer: Self-pay | Admitting: Obstetrics and Gynecology

## 2019-05-10 DIAGNOSIS — N939 Abnormal uterine and vaginal bleeding, unspecified: Secondary | ICD-10-CM

## 2019-05-10 NOTE — Telephone Encounter (Signed)
Ultrasound order is in sometime in next 1-2 weeks

## 2019-05-11 NOTE — Telephone Encounter (Signed)
Called and left voicemail for patient to call back to be scheduled. 

## 2019-05-15 ENCOUNTER — Other Ambulatory Visit: Payer: Self-pay

## 2019-05-15 ENCOUNTER — Emergency Department (HOSPITAL_COMMUNITY)
Admission: EM | Admit: 2019-05-15 | Discharge: 2019-05-16 | Disposition: A | Discharge status: Home / Self Care | Payer: BC Managed Care – PPO | Attending: Emergency Medicine | Admitting: Emergency Medicine

## 2019-05-15 ENCOUNTER — Encounter (HOSPITAL_COMMUNITY): Payer: Self-pay | Admitting: Emergency Medicine

## 2019-05-15 DIAGNOSIS — R102 Pelvic and perineal pain: Secondary | ICD-10-CM | POA: Diagnosis not present

## 2019-05-15 DIAGNOSIS — R9389 Abnormal findings on diagnostic imaging of other specified body structures: Secondary | ICD-10-CM | POA: Diagnosis not present

## 2019-05-15 DIAGNOSIS — R0602 Shortness of breath: Secondary | ICD-10-CM | POA: Diagnosis not present

## 2019-05-15 DIAGNOSIS — R42 Dizziness and giddiness: Secondary | ICD-10-CM | POA: Diagnosis not present

## 2019-05-15 DIAGNOSIS — R52 Pain, unspecified: Secondary | ICD-10-CM

## 2019-05-15 DIAGNOSIS — Z720 Tobacco use: Secondary | ICD-10-CM | POA: Diagnosis not present

## 2019-05-15 DIAGNOSIS — N939 Abnormal uterine and vaginal bleeding, unspecified: Secondary | ICD-10-CM | POA: Diagnosis not present

## 2019-05-15 DIAGNOSIS — N8500 Endometrial hyperplasia, unspecified: Secondary | ICD-10-CM | POA: Diagnosis not present

## 2019-05-15 DIAGNOSIS — N83201 Unspecified ovarian cyst, right side: Secondary | ICD-10-CM | POA: Diagnosis not present

## 2019-05-15 DIAGNOSIS — D649 Anemia, unspecified: Secondary | ICD-10-CM | POA: Insufficient documentation

## 2019-05-15 NOTE — ED Triage Notes (Signed)
Pt reports heavy bleeding for 19 days, described as "clotts and after birth bleeding."  She report weakness, dizziness. No energy to work or stay awake.  Has appointment for [place] on the 30th, can not wait.

## 2019-05-16 ENCOUNTER — Encounter (HOSPITAL_COMMUNITY): Payer: Self-pay | Admitting: Student

## 2019-05-16 ENCOUNTER — Other Ambulatory Visit (HOSPITAL_COMMUNITY): Payer: BC Managed Care – PPO

## 2019-05-16 ENCOUNTER — Emergency Department (HOSPITAL_COMMUNITY): Payer: BC Managed Care – PPO

## 2019-05-16 DIAGNOSIS — N83201 Unspecified ovarian cyst, right side: Secondary | ICD-10-CM | POA: Diagnosis not present

## 2019-05-16 LAB — COMPREHENSIVE METABOLIC PANEL
ALT: 15 U/L (ref 0–44)
AST: 20 U/L (ref 15–41)
Albumin: 3.7 g/dL (ref 3.5–5.0)
Alkaline Phosphatase: 66 U/L (ref 38–126)
Anion gap: 10 (ref 5–15)
BUN: 10 mg/dL (ref 6–20)
CO2: 24 mmol/L (ref 22–32)
Calcium: 9.2 mg/dL (ref 8.9–10.3)
Chloride: 103 mmol/L (ref 98–111)
Creatinine, Ser: 0.73 mg/dL (ref 0.44–1.00)
GFR calc Af Amer: >60 mL/min (ref >60)
GFR calc non Af Amer: >60 mL/min (ref >60)
Glucose, Bld: 153 mg/dL — ABNORMAL HIGH (ref 70–99)
Potassium: 4 mmol/L (ref 3.5–5.1)
Sodium: 137 mmol/L (ref 135–145)
Total Bilirubin: 0.3 mg/dL (ref 0.3–1.2)
Total Protein: 6.6 g/dL (ref 6.5–8.1)

## 2019-05-16 LAB — CBC
HCT: 27.2 % — ABNORMAL LOW (ref 36.0–46.0)
Hemoglobin: 8.4 g/dL — ABNORMAL LOW (ref 12.0–15.0)
MCH: 27 pg (ref 26.0–34.0)
MCHC: 30.9 g/dL (ref 30.0–36.0)
MCV: 87.5 fL (ref 80.0–100.0)
Platelets: 466 10*3/uL — ABNORMAL HIGH (ref 150–400)
RBC: 3.11 MIL/uL — ABNORMAL LOW (ref 3.87–5.11)
RDW: 14.1 % (ref 11.5–15.5)
WBC: 8.9 10*3/uL (ref 4.0–10.5)
nRBC: 0 % (ref 0.0–0.2)

## 2019-05-16 LAB — WET PREP, GENITAL
Clue Cells Wet Prep HPF POC: NONE SEEN
Sperm: NONE SEEN
Trich, Wet Prep: NONE SEEN
Yeast Wet Prep HPF POC: NONE SEEN

## 2019-05-16 LAB — I-STAT BETA HCG BLOOD, ED (MC, WL, AP ONLY): I-stat hCG, quantitative: <5 m[IU]/mL (ref <5)

## 2019-05-16 MED ORDER — FENTANYL CITRATE (PF) 100 MCG/2ML IJ SOLN
50.0000 ug | Freq: Once | INTRAMUSCULAR | Status: AC
  Administered 2019-05-16: 09:00:00 50 ug via INTRAVENOUS
  Filled 2019-05-16: qty 2

## 2019-05-16 MED ORDER — SODIUM CHLORIDE 0.9 % IV BOLUS
1000.0000 mL | Freq: Once | INTRAVENOUS | Status: AC
  Administered 2019-05-16: 1000 mL via INTRAVENOUS

## 2019-05-16 MED ORDER — TRANEXAMIC ACID 650 MG PO TABS: ORAL_TABLET | ORAL | 0 refills | Status: DC

## 2019-05-16 NOTE — Discharge Instructions (Addendum)
You were seen in the ER today for vaginal bleeding and dizziness.  Your work-up revealed that your hemoglobin and hematocrit are consistent with anemia.  Your ultrasound showed a short endometrium is thicker than normal and that you have a complicated right ovarian cyst.  Each of these findings need to be followed up by your OB/GYN doctor, you may require an MRI potentially surgery to further evaluate your cyst.  You are sending home with transischemic acid to take 3 times daily to help with vaginal bleeding.  Only take this medication on days that you are having bleeding.  We have prescribed you new medication(s) today. Discuss the medications prescribed today with your pharmacist as they can have adverse effects and interactions with your other medicines including over the counter and prescribed medications. Seek medical evaluation if you start to experience new or abnormal symptoms after taking one of these medicines, seek care immediately if you start to experience difficulty breathing, feeling of your throat closing, facial swelling, or rash as these could be indications of a more serious allergic reaction  Please continue to take your iron supplement.  Please call your OB/GYN doctor's office today to schedule a close follow-up appointment within the next 1 to 3 days.  Return to the ER for new or worsening symptoms including but not limited to increased lightheadedness/dizziness, passing out, increase trouble breathing, increased bleeding, increased pain, fever, or any other concerns.

## 2019-05-16 NOTE — ED Notes (Signed)
Patient verbalizes understanding of discharge instructions. Opportunity for questioning and answers were provided. Armband removed by staff, pt discharged from ED ambulatory w/ husband  

## 2019-05-16 NOTE — ED Provider Notes (Signed)
[facility] EMERGENCY DEPARTMENT Provider Note   CSN: [id number] Arrival date & time: 05/15/19  2222     History Chief Complaint  Patient presents with  . Vaginal Bleeding    Lauren Cox is a 44 y.o. female with a history of tobacco abuse, anxiety, & prior cervical cone biopsy in 2016 who presents to the ED with complaints of worsened vaginal bleeding since January 2021. Patient states that she has had long standing issues of long duration heavy periods, worse over the past few years, usually bleeds 7-10 days. Since January she has started to bleed more around 15-20 days each month, passing heavy clots, with associated pelvic cramping as well as fatigue. She has been bleeding for 20 days now in the month of April, states over the past 1 week she has started to feel dizzy/lightheaded and at times short of breath with chest tightness, more so when she is up and doing things. No other alleviating/aggravating factors. She is followed by OBGYN Dr. Bonney Aid- plan is for outpatient [place] & clinic follow up, she felt she could not wait. She states she has had an IUD previously and had issues with it therefore she does not wish to persue this again, she has also taken different forms of birth control, but due to her pseudotumor cerebri she was told she cannot take these medications by her neurologist as with them her pressures have gone up and she has had headaches in the past. She denies fever, chills, N/V, syncope, vaginal discharge, or dysuria.    HPI     Past Medical History:  Diagnosis Date  . Anxiety     There are no problems to display for this patient.   Past Surgical History:  Procedure Laterality Date  . CERVICAL CONE BIOPSY  2016  . CHOLECYSTECTOMY  2006  . OPTIC NERVE SHEATH FENESTRATIAN  02/2018  . SPINAL FUSION       OB History    Gravida  4   Para  2   Term      Preterm      AB  2   Living  2     SAB  2   TAB      Ectopic      Multiple        Live Births  2           No family history on file.  Social History   Tobacco Use  . Smoking status: Current Some Day Smoker    Packs/day: 0.50    Types: Cigarettes  . Smokeless tobacco: Never Used  Substance Use Topics  . Alcohol use: Yes    Comment: occ  . Drug use: No    Home Medications Prior to Admission medications   Medication Sig Start Date End Date Taking? Authorizing Provider  acetaZOLAMIDE (DIAMOX) 500 MG capsule Take 500 mg by mouth 2 (two) times daily.    [provider]  citalopram (CELEXA) 20 MG tablet Take 1 tablet (20 mg total) by mouth daily. Patient not taking: Reported on 09/20/2018 07/02/17   Vena Austria, MD  EPINEPHrine 0.3 mg/0.3 mL IJ SOAJ injection Inject 0.3 mg into the muscle. 07/23/12   [provider]  Multiple Vitamin (MULTIVITAMIN) capsule Take 1 capsule by mouth daily.    [provider]  norethindrone (AYGESTIN) 5 MG tablet Take 1 tablet (5 mg total) by mouth daily. Patient not taking: Reported on 09/20/2018 07/02/17   Vena Austria, MD  Allergies    Sulfa antibiotics, Fish-derived products, and Penicillins  Review of Systems   Review of Systems  Constitutional: Positive for fatigue. Negative for chills and fever.  Respiratory: Positive for chest tightness and shortness of breath.   Gastrointestinal: Negative for diarrhea, nausea and vomiting.  Genitourinary: Positive for pelvic pain and vaginal bleeding. Negative for dysuria and vaginal discharge.  Neurological: Positive for dizziness and light-headedness. Negative for syncope.  All other systems reviewed and are negative.   Physical Exam Updated Vital Signs BP 137/76 (BP Location: Right Arm)   Pulse 78   Temp 98.7 F (37.1 C) (Oral)   Resp 18   Ht 5\' 2"  (1.575 m)   Wt 90.7 kg   SpO2 98%   BMI 36.58 kg/m   Physical Exam Vitals and nursing note reviewed. Exam conducted with a chaperone present.  Constitutional:      General: She is  not in acute distress.    Appearance: She is well-developed. She is not toxic-appearing.  HENT:     Head: Normocephalic and atraumatic.  Eyes:     General:        Right eye: No discharge.        Left eye: No discharge.     Conjunctiva/sclera: Conjunctivae normal.  Cardiovascular:     Rate and Rhythm: Normal rate and regular rhythm.  Pulmonary:     Effort: Pulmonary effort is normal. No respiratory distress.     Breath sounds: Normal breath sounds. No wheezing, rhonchi or rales.  Abdominal:     General: There is no distension.     Palpations: Abdomen is soft.     Tenderness: There is abdominal tenderness (suprapubic). There is no guarding or rebound.  Genitourinary:    Labia:        Right: No lesion.        Left: No lesion.      Comments: Blood present with clot in the vaginal vault. No discharge noted. Diffuse tenderness throughout bimanual exam.  Musculoskeletal:     Cervical back: Neck supple.  Skin:    General: Skin is warm and dry.     Findings: No rash.  Neurological:     Mental Status: She is alert.     Comments: Clear speech.   Psychiatric:        Behavior: Behavior normal.    ED Results / Procedures / Treatments   Labs (all labs ordered are listed, but only abnormal results are displayed) Labs Reviewed  WET PREP, GENITAL - Abnormal; Notable for the following components:      Result Value   WBC, Wet Prep HPF POC FEW (*)    All other components within normal limits  CBC - Abnormal; Notable for the following components:   RBC 3.11 (*)    Hemoglobin 8.4 (*)    HCT 27.2 (*)    Platelets 466 (*)    All other components within normal limits  COMPREHENSIVE METABOLIC PANEL - Abnormal; Notable for the following components:   Glucose, Bld 153 (*)    All other components within normal limits  I-STAT BETA HCG BLOOD, ED ([facility], [facility], [facility] ONLY)  GC/CHLAMYDIA PROBE AMP () NOT AT [facility]    EKG EKG Interpretation  Date/Time:  Tuesday May 16 2019 08:32:31  EDT Ventricular Rate:  71 PR Interval:    QRS Duration: 92 QT Interval:  405 QTC Calculation: 441 R Axis:   87 Text Interpretation: Sinus rhythm Baseline wander in lead(s) II III aVF  Confirmed by Cathren Laine ([id number]) on 05/16/2019 8:45:05 AM   Radiology US PELVIC COMPLETE W TRANSVAGINAL AND TORSION R/O  Result Date: 05/16/2019 CLINICAL DATA:  Heavy vaginal bleeding and pelvic pain for 4 months, dysfunctional uterine bleeding EXAM: TRANSABDOMINAL AND TRANSVAGINAL ULTRASOUND OF PELVIS DOPPLER ULTRASOUND OF OVARIES TECHNIQUE: Both transabdominal and transvaginal ultrasound examinations of the pelvis were performed. Transabdominal technique was performed for global imaging of the pelvis including uterus, ovaries, adnexal regions, and pelvic cul-de-sac. It was necessary to proceed with endovaginal exam following the transabdominal exam to visualize the uterus, endometrium, and ovaries. Color and duplex Doppler ultrasound was utilized to evaluate blood flow to the ovaries. COMPARISON:  07/20/2017 FINDINGS: Uterus Measurements: 7.9 x 5.3 x 6.1 cm = volume: 134 mL. Retroverted. Normal morphology without mass Endometrium Thickness: 16 mm. Thickened, 17 mm, heterogeneous. No definite focal mass or endometrial fluid. Right ovary Measurements: 6.0 x 4.1 x 3.4 cm = volume: 43.9 mL. Cyst within RIGHT ovary 4.0 x 4.6 x 2.3 cm, contains a small internal septation as well as a small mural nodule which measures 7 x 7 x 13 mm diameter. Left ovary Measurements: 3.5 x 1.7 x 3.1 cm = volume: 9.5 mL. Normal morphology without mass Pulsed Doppler evaluation of both ovaries demonstrates low resistance arterial and venous waveforms within both ovaries Other findings Trace free pelvic fluid.  No other adnexal masses. IMPRESSION: Mildly thickened heterogeneous endometrial complex 17 mm thick; if bleeding remains unresponsive to hormonal or medical therapy, focal lesion work-up with sonohysterogram should be considered. Endometrial  biopsy should also be considered in pre-menopausal patients at high risk for endometrial carcinoma. (Ref: Radiological Reasoning: Algorithmic Workup of Abnormal Vaginal Bleeding with Endovaginal Sonography and Sonohysterography. AJR 2008; 401:U27-25). Complicated cyst RIGHT ovary 4.6 cm greatest size containing a septation as well as a 7 x 7 x 13 mm nodule; blood flow was not assessed by color Doppler imaging within the mural nodule. Either further characterization by MR or surgical evaluation recommended. This recommendation follows the consensus statement: Management of Asymptomatic Ovarian and Other Adnexal Cysts Imaged at [place]: Society of Radiologists in Ultrasound Consensus Conference Statement. Radiology 2010; [phone]. Electronically Signed   By: Ulyses Southward M.D.   On: 05/16/2019 10:28    Procedures Procedures (including critical care time)  09:00AM Cardiac monitoring reveals NSR at a rate of 75 as reviewed and interpreted by me. Cardiac monitoring was ordered due to lightheadedness and to monitor patient for dysrhythmia.   Medications Ordered in ED Medications  sodium chloride 0.9 % bolus 1,000 mL (0 mLs Intravenous Stopped 05/16/19 1017)  fentaNYL (SUBLIMAZE) injection 50 mcg (50 mcg Intravenous Given 05/16/19 0844)    ED Course  I have reviewed the triage vital signs and the nursing notes.  Pertinent labs & imaging results that were available during my care of the patient were reviewed by me and considered in my medical decision making (see chart for details).    MDM Rules/Calculators/A&P                     Patient presents to the ED with complaints of heavy vaginal bleeding and intermittent lightheadedness. She is nontoxic, vitals without significant abnormality. Exam with suprapubic abdominal tenderness & diffuse discomfort throughout bimanual, blood with mild clot present in vaginal vault.  DDX: abnormal uterine bleeding, menorrhagia, ovarian cyst, cancerous process, ectopic  pregnancy, anemia.   Additional history obtained from chart review.   Labs per triage have been reviewed & interpreted by me:  CBC: anemia with hgb/hct 8.4/27.2 progressed from prior. No leukocytosis. Mildly elevated platelet count.  CMP: No significant electrolyte derangement. Mild hyperglycemia. Renal function preserved.  Pregnancy test negative.   Subsequently obtained wet prep with few WBCs, no trich, yeast or BV.   Order placed for [place], EKG, and cardiac monitoring for further assessment. Fentanyl for pain, fluids for lightheadedness.   EKG: Sinus rhythm.  Cardiac monitor fairly unremarkable.   [place]: Mildly thickened heterogeneous endometrial complex 17 mm thick--> will need follow up per above recommendations. Complicated cyst RIGHT ovary 4.6 cm greatest size containing a septation as well as a 7 x 7 x 13 mm nodule; blood flow was not assessed by color Doppler imaging within the mural nodule. Either further characterization by MR or surgical evaluation recommended.   11:16AM: CONSULT: Discussed case with OBGYN Dr. Kennon Rounds including patient presentation, imaging, and labs- States follow up on US findings can be done as outpatient, recommends starting patient on TXA 1300 mg PO TID during bleeding, and to follow up with her OBGYN outpatient. Appreciate consultation.   Plan carried out as discussed.  Patient does not appear to require emergent transfusion. She is currently taking iron supplements instructed she continue this, will start TXA per discussion with Dr. Kennon Rounds. OBGYN follow up. I discussed results, treatment plan, need for follow-up, and return precautions with the patient. Provided opportunity for questions, patient confirmed understanding and is in agreement with plan.   This is a shared visit with supervising physician Dr. Ashok Cordia who has independently evaluated patient & provided guidance in evaluation/management/disposition, in agreement with care   Final Clinical Impression(s) /  ED Diagnoses Final diagnoses:  Vaginal bleeding  Thickened endometrium  Cyst of right ovary  Anemia, unspecified type    Rx / DC Orders ED Discharge Orders         Ordered    tranexamic acid (LYSTEDA) 650 MG TABS tablet     05/16/19 1132           Wyeth Hoffer, [place] R, PA-C 05/16/19 1325    Lajean Saver, MD 05/16/19 1511

## 2019-05-17 LAB — GC/CHLAMYDIA PROBE AMP (~~LOC~~) NOT AT ARMC
Chlamydia: NEGATIVE
Comment: NEGATIVE
Comment: NORMAL
Neisseria Gonorrhea: NEGATIVE

## 2019-05-19 NOTE — Telephone Encounter (Signed)
Noted. Mirena reserved for this patient. 

## 2019-05-19 NOTE — Telephone Encounter (Signed)
Patient just spoke w/AMS. Inquiring if she is to continue the TXA until she sees you or if she is to d/c. Ok to reply via my chart per patient.

## 2019-05-19 NOTE — Telephone Encounter (Signed)
So does not need the ultrasound with her follow up on 4/30 but I will be doing an endometrial biopsy and placing a Mirena IUD

## 2019-05-19 NOTE — Telephone Encounter (Addendum)
Pt reports she has apt 05/26/19 w/AMS, but was seen in ER on 4/19 for excessive bleeding w/H&H of 8 & 27 & put her on TXA and told her not to return to work until she checked in with [facility]MS within 3 days. She has no energy. She is exhausted.The meds help with heavy bleeding, but anytime she picks up her child or pushes/pulls, she starts bleeding again. She's inquiring if it's reasonable for her to stay out of work this weekend. She has a very active (lifting) job. She will need a note for this since it isn't covid related. She left details about u/s and f/u apt in my chart. MH#[phone]

## 2019-05-19 NOTE — Telephone Encounter (Signed)
Patient's aware of change to the appointment schedule. Patient is schedule for 05/25/19 for Mirena placement

## 2019-05-25 ENCOUNTER — Encounter: Payer: Self-pay | Admitting: Obstetrics and Gynecology

## 2019-05-25 ENCOUNTER — Ambulatory Visit (INDEPENDENT_AMBULATORY_CARE_PROVIDER_SITE_OTHER): Payer: BC Managed Care – PPO | Admitting: Obstetrics and Gynecology

## 2019-05-25 ENCOUNTER — Other Ambulatory Visit: Payer: Self-pay

## 2019-05-25 ENCOUNTER — Other Ambulatory Visit (HOSPITAL_COMMUNITY)
Admission: RE | Admit: 2019-05-25 | Discharge: 2019-05-25 | Disposition: A | Discharge status: Home / Self Care | Payer: BC Managed Care – PPO | Source: Ambulatory Visit | Attending: Obstetrics and Gynecology | Admitting: Obstetrics and Gynecology

## 2019-05-25 VITALS — BP 116/72 | Ht 62.0 in | Wt 204.0 lb

## 2019-05-25 DIAGNOSIS — N939 Abnormal uterine and vaginal bleeding, unspecified: Secondary | ICD-10-CM | POA: Insufficient documentation

## 2019-05-25 DIAGNOSIS — N83201 Unspecified ovarian cyst, right side: Secondary | ICD-10-CM | POA: Diagnosis not present

## 2019-05-25 DIAGNOSIS — R9389 Abnormal findings on diagnostic imaging of other specified body structures: Secondary | ICD-10-CM | POA: Insufficient documentation

## 2019-05-25 NOTE — Progress Notes (Signed)
Gynecology Abnormal Uterine Bleeding Initial Evaluation   Chief Complaint:  Chief Complaint  Patient presents with  . Procedure    endometrial biopsy    History of Present Illness:    [name] is a 43 y.o. [id number] who LMP was No LMP recorded. (Menstrual status: Irregular Periods)., presents today for a problem visit.  She complains of metrorrhagia (throughout cycle) that  began several months ago and its severity is described as severe.  The patient menstrual complaints are chronic present for the oast 6 months but with recent acute exacerbation. She has had bleeding since early January.  Actually became symptomatic and almost passed out at work prompting ER evaluation.  The patient is sexually active.  Was previously on norethindrone started on TXA in ER.  Last Pap results results were obtained 09/20/2018  NIL and HR HPV negative   Previous evaluation: ER evaluation 05/16/2019 pelvic ultrasound Complicated cyst RIGHT ovary 4.6 cm greatest size containing a septation as well as a 7 x 7 x 13 mm nodule; blood flow was not assessed by color Doppler imaging within the mural nodule.  EMS of 53mm.  H&H of 8.4 and 27.2  Previous Treatment: TXA  Prior diagnosis PCOS  LMP: No LMP recorded. (Menstrual status: Irregular Periods).  Paramter Normal / Abnormal Prsent  Frequency [place]     Infrequent (>38 days)     Normal (?24 days ?38 days)     Freequent (<24 days) X  Duration Normal (?8 days)     Prolonged (>8 days) X  Regularity Regular (shortest to longest cycle variation ?7-9 days)*     Irregular (shortest to longest cycle variation ?8-10days)* X  Flow Volume Light    (Self reported) Normal     Heavy X      Intermenstrual Bleeding None     Random X   Cyclical early     Cyclical mid     Cyclical late        Unscheduled Bleeding  Not applicable    (exogenous hormones) Absent     Present X   FIGO AUB I System: *The available evidence suggests that, using these criteria, the  normal range (shortest to longest) varies with age: 36-25 y of age, ?2 d; 90-41 y, ?7 d; and for 87-45 y, ?9 d    Review of Systems: ROS  Past Medical History:  There are no problems to display for this patient.   Past Surgical History:  Past Surgical History:  Procedure Laterality Date  . CERVICAL CONE BIOPSY  2016  . CHOLECYSTECTOMY  2006  . OPTIC NERVE SHEATH FENESTRATIAN  02/2018  . SPINAL FUSION      Obstetric History: [id number]  Family History:  No family history on file.  Social History:  Social History   Socioeconomic History  . Marital status: Single    Spouse name: Not on file  . Number of children: Not on file  . Years of education: Not on file  . Highest education level: Not on file  Occupational History  . Not on file  Tobacco Use  . Smoking status: Current Some Day Smoker    Packs/day: 0.50    Types: Cigarettes  . Smokeless tobacco: Never Used  Substance and Sexual Activity  . Alcohol use: Yes    Comment: occ  . Drug use: No  . Sexual activity: Yes    Partners: Male    Birth control/protection: None  Other Topics Concern  . Not on file  Social History Narrative  . Not on file   Social Determinants of Health   Financial Resource Strain:   . Difficulty of Paying Living Expenses:   Food Insecurity:   . Worried About Programme researcher, broadcasting/film/video in the Last Year:   . Barista in the Last Year:   Transportation Needs:   . Freight forwarder (Medical):   Marland Kitchen Lack of Transportation (Non-Medical):   Physical Activity:   . Days of Exercise per Week:   . Minutes of Exercise per Session:   Stress:   . Feeling of Stress :   Social Connections:   . Frequency of Communication with Friends and Family:   . Frequency of Social Gatherings with Friends and Family:   . Attends Religious Services:   . Active Member of Clubs or Organizations:   . Attends Banker Meetings:   Marland Kitchen Marital Status:   Intimate Partner Violence:   . Fear of Current  or Ex-Partner:   . Emotionally Abused:   Marland Kitchen Physically Abused:   . Sexually Abused:     Allergies:  Allergies  Allergen Reactions  . Sulfa Antibiotics Anaphylaxis  . Fish-Derived Products     Other reaction(s): SWELLING  . Penicillins Rash    Medications: Prior to Admission medications   Medication Sig Start Date End Date Taking? Authorizing Provider  EPINEPHrine 0.3 mg/0.3 mL IJ SOAJ injection Inject 0.3 mg into the muscle. 07/23/12  Yes [provider]  Prenatal Vit-Fe Fumarate-FA (PRENATAL PO) Take by mouth daily.   Yes [provider]  tranexamic acid (LYSTEDA) 650 MG TABS tablet Take on days with vaginal bleeding, if not bleeding do not need to take. 05/16/19  Yes Petrucelli, Pleas Koch, PA-C    Physical Exam Blood pressure 116/72, height 5\' 2"  (1.575 m), weight 204 lb (92.5 kg).  No LMP recorded. (Menstrual status: Irregular Periods).  General: NAD HEENT: normocephalic, anicteric Thyroid: no enlargement, no palpable nodules Pulmonary: No increased work of breathing Cardiovascular: RRR, distal pulses 2+ Abdomen: NABS, soft, non-tender, non-distended.  Umbilicus without lesions.  No hepatomegaly, splenomegaly or masses palpable. No evidence of hernia  Genitourinary:  External: Normal external female genitalia.  Normal urethral meatus, normal Bartholin's and Skene's glands.    Vagina: Normal vaginal mucosa, no evidence of prolapse.    Cervix: Grossly normal in appearance, no bleeding  Uterus: Non-enlarged, mobile, normal contour.  No CMT  Adnexa: ovaries non-enlarged, no adnexal masses  Rectal: deferred  Lymphatic: no evidence of inguinal lymphadenopathy Extremities: no edema, erythema, or tenderness Neurologic: Grossly intact Psychiatric: mood appropriate, affect full  Female chaperone present for pelvic portions of the physical exam   ENDOMETRIAL BIOPSY     The indications for endometrial biopsy were reviewed.   Risks of the biopsy including  cramping, bleeding, infection, uterine perforation, inadequate specimen and need for additional procedures  were discussed. The patient states she understands and agrees to undergo procedure today. Consent was signed. Time out was performed. Urine HCG was negative. A Graves speculum was placed and the cervix was brought into view.  The cervix was prepped with Betadine. A single-toothed tenaculum was not placed on the anterior lip of the cervix for traction. A 3 mm pipelle was introduced through the cervix into the endometrial cavity without difficulty to a depth of 8cm, and a large amount of tissue was obtained, the resulting specime sent to pathology. The instruments were removed from the patient's vagina. Minimal bleeding from the cervix was noted.  The patient tolerated the procedure well. Routine post-procedure instructions were given to the patient.  She will be contacted by phone one results become available.     GYNECOLOGY OFFICE PROCEDURE NOTE  Lauren Cox is a 44 y.o. [id number] here for a Mirena IUD insertion. No GYN concerns.  Last pap smear was on 09/20/2018 NIL HPV negative and was normal, preceeding pap was 07/02/2017 ASCUS HPV negative.  The indication for her IUD is contraception/cycle control.  IUD Insertion Procedure Note Patient identified, informed consent performed, consent signed.   Discussed risks of irregular bleeding, cramping, infection, malpositioning, expulsion or uterine perforation of the IUD (1:1000 placements)  which may require further procedure such as laparoscopy.  IUD while effective at preventing pregnancy do not prevent transmission of sexually transmitted diseases and use of barrier methods for this purpose was discussed. Time out was performed.  Urine pregnancy test negative.  Speculum placed in the vagina.  Cervix visualized.  Cleaned with Betadine x 2.  Grasped anteriorly with a single tooth tenaculum.  Uterus sounded to 8cm with prior endometrial biopsy. IUD placed per  manufacturer's recommendations.  Strings trimmed to 3 cm. Tenaculum was removed, good hemostasis noted.  Patient tolerated procedure well.   Patient was given post-procedure instructions.  She was advised to have backup contraception for one week.  Patient was also asked to check IUD strings periodically and follow up in 6 weeks for IUD check.  IUD insertion CPT 58300,  Skyla J7301 Mirena J7298 Liletta J7297 Paraguard J7300 Rutha Bouchard [phone] Modifer 25, plus Modifer 79 is done during a global billing visit   Vena Austria, MD, [facility], [facility]    Assessment: 44 y.o. [phone] with abnormal uterine bleeding  Plan: Problem List Items Addressed This Visit    None    Visit Diagnoses    Abnormal uterine bleeding    -  Primary   Relevant Orders   CBC   TSH+Prl+FSH+TestT+LH+DHEA S...   Surgical pathology   Thickened endometrium       Relevant Orders   Surgical pathology   US Transvaginal Non-OB   Right ovarian cyst       Relevant Orders   US Transvaginal Non-OB      1) Discussed management options for abnormal uterine bleeding including expectant, NSAIDs, tranexamic acid (Lysteda), oral progesterone (Provera, norethindrone, megace), Depo Provera, Levonorgestrel containing IUD, endometrial ablation (Novasure) or hysterectomy as definitive surgical management.  Discussed risks and benefits of each method.   Final management decision will hinge on results of patient's work up and whether an underlying etiology for the patients bleeding symptoms can be discerned.  We will conduct a basic work up examining using the PALM-COIEN classification system.  The role of unopposed estrogen in the development of endometrial hyperplasia or carcinoma is discussed.  The risk of endometrial hyperplasia is linearly correlated with increasing BMI given the production of estrone by adipose tissue. Printed patient education handouts were given to the patient to review at home.   Bleeding precautions reviewed.  - Mirena placed today - Repeat PCOS labs  2) Right ovarian cyst - follow up ultrasound in 6 week  2) Return in about 6 weeks (around 07/06/2019) for IUD string check and ultrasound follow up ovarian cyst.   Vena Austria, MD, [facility], [facility] 05/25/2019, 12:17 PM

## 2019-05-26 LAB — SURGICAL PATHOLOGY

## 2019-06-02 LAB — CBC
Hematocrit: 26 % — ABNORMAL LOW (ref 34.0–46.6)
Hemoglobin: 8.1 g/dL — ABNORMAL LOW (ref 11.1–15.9)
MCH: 26.6 pg (ref 26.6–33.0)
MCHC: 31.2 g/dL — ABNORMAL LOW (ref 31.5–35.7)
MCV: 85 fL (ref 79–97)
Platelets: 497 10*3/uL — ABNORMAL HIGH (ref 150–450)
RBC: 3.05 x10E6/uL — ABNORMAL LOW (ref 3.77–5.28)
RDW: 14.5 % (ref 11.7–15.4)
WBC: 8.2 10*3/uL (ref 3.4–10.8)

## 2019-06-02 LAB — TSH+PRL+FSH+TESTT+LH+DHEA S...
17-Hydroxyprogesterone: 26 ng/dL
Androstenedione: 82 ng/dL (ref 41–262)
DHEA-SO4: 199 ug/dL (ref 57.3–279.2)
FSH: 5.5 m[IU]/mL
LH: 5 m[IU]/mL
Prolactin: 10.5 ng/mL (ref 4.8–23.3)
TSH: 5.51 u[IU]/mL — ABNORMAL HIGH (ref 0.450–4.500)
Testosterone, Free: 1.2 pg/mL (ref 0.0–4.2)
Testosterone: 17 ng/dL (ref 8–48)

## 2019-07-07 ENCOUNTER — Ambulatory Visit: Payer: BC Managed Care – PPO | Admitting: Obstetrics and Gynecology

## 2019-07-07 ENCOUNTER — Ambulatory Visit: Payer: BC Managed Care – PPO

## 2019-07-07 DIAGNOSIS — R9389 Abnormal findings on diagnostic imaging of other specified body structures: Secondary | ICD-10-CM

## 2019-07-07 DIAGNOSIS — N83201 Unspecified ovarian cyst, right side: Secondary | ICD-10-CM

## 2019-07-18 ENCOUNTER — Ambulatory Visit: Payer: BC Managed Care – PPO | Admitting: Obstetrics and Gynecology

## 2019-07-18 ENCOUNTER — Ambulatory Visit: Payer: BC Managed Care – PPO

## 2019-07-30 DIAGNOSIS — S99911A Unspecified injury of right ankle, initial encounter: Secondary | ICD-10-CM | POA: Diagnosis not present

## 2019-07-30 DIAGNOSIS — S81811A Laceration without foreign body, right lower leg, initial encounter: Secondary | ICD-10-CM | POA: Diagnosis not present

## 2019-07-30 DIAGNOSIS — F17200 Nicotine dependence, unspecified, uncomplicated: Secondary | ICD-10-CM | POA: Diagnosis not present

## 2019-07-30 DIAGNOSIS — M79661 Pain in right lower leg: Secondary | ICD-10-CM | POA: Diagnosis not present

## 2019-08-02 DIAGNOSIS — M25571 Pain in right ankle and joints of right foot: Secondary | ICD-10-CM | POA: Diagnosis not present

## 2019-08-02 DIAGNOSIS — A419 Sepsis, unspecified organism: Secondary | ICD-10-CM | POA: Diagnosis not present

## 2019-08-02 DIAGNOSIS — F17210 Nicotine dependence, cigarettes, uncomplicated: Secondary | ICD-10-CM | POA: Diagnosis not present

## 2019-08-02 DIAGNOSIS — M7989 Other specified soft tissue disorders: Secondary | ICD-10-CM | POA: Diagnosis not present

## 2019-08-02 DIAGNOSIS — T148XXA Other injury of unspecified body region, initial encounter: Secondary | ICD-10-CM | POA: Diagnosis not present

## 2019-08-02 DIAGNOSIS — S81811D Laceration without foreign body, right lower leg, subsequent encounter: Secondary | ICD-10-CM | POA: Diagnosis not present

## 2019-08-02 DIAGNOSIS — L03115 Cellulitis of right lower limb: Secondary | ICD-10-CM | POA: Diagnosis not present

## 2019-08-02 DIAGNOSIS — S81811A Laceration without foreign body, right lower leg, initial encounter: Secondary | ICD-10-CM | POA: Diagnosis not present

## 2019-08-02 DIAGNOSIS — M79604 Pain in right leg: Secondary | ICD-10-CM | POA: Diagnosis not present

## 2019-08-02 DIAGNOSIS — D649 Anemia, unspecified: Secondary | ICD-10-CM | POA: Diagnosis not present

## 2019-10-05 DIAGNOSIS — F331 Major depressive disorder, recurrent, moderate: Secondary | ICD-10-CM | POA: Diagnosis not present

## 2019-10-09 DIAGNOSIS — F4312 Post-traumatic stress disorder, chronic: Secondary | ICD-10-CM | POA: Diagnosis not present

## 2019-10-09 DIAGNOSIS — F411 Generalized anxiety disorder: Secondary | ICD-10-CM | POA: Diagnosis not present

## 2019-10-09 DIAGNOSIS — F331 Major depressive disorder, recurrent, moderate: Secondary | ICD-10-CM | POA: Diagnosis not present

## 2019-10-13 DIAGNOSIS — F4312 Post-traumatic stress disorder, chronic: Secondary | ICD-10-CM | POA: Diagnosis not present

## 2019-10-13 DIAGNOSIS — F411 Generalized anxiety disorder: Secondary | ICD-10-CM | POA: Diagnosis not present

## 2019-10-13 DIAGNOSIS — F331 Major depressive disorder, recurrent, moderate: Secondary | ICD-10-CM | POA: Diagnosis not present

## 2019-10-20 DIAGNOSIS — F411 Generalized anxiety disorder: Secondary | ICD-10-CM | POA: Diagnosis not present

## 2019-10-20 DIAGNOSIS — F4312 Post-traumatic stress disorder, chronic: Secondary | ICD-10-CM | POA: Diagnosis not present

## 2019-10-20 DIAGNOSIS — F331 Major depressive disorder, recurrent, moderate: Secondary | ICD-10-CM | POA: Diagnosis not present

## 2019-11-03 DIAGNOSIS — F331 Major depressive disorder, recurrent, moderate: Secondary | ICD-10-CM | POA: Diagnosis not present

## 2019-11-22 DIAGNOSIS — F331 Major depressive disorder, recurrent, moderate: Secondary | ICD-10-CM | POA: Diagnosis not present

## 2019-11-24 DIAGNOSIS — F331 Major depressive disorder, recurrent, moderate: Secondary | ICD-10-CM | POA: Diagnosis not present

## 2019-12-03 DIAGNOSIS — S92405A Nondisplaced unspecified fracture of left great toe, initial encounter for closed fracture: Secondary | ICD-10-CM | POA: Diagnosis not present

## 2020-01-24 DIAGNOSIS — F331 Major depressive disorder, recurrent, moderate: Secondary | ICD-10-CM | POA: Diagnosis not present

## 2020-01-24 DIAGNOSIS — F4312 Post-traumatic stress disorder, chronic: Secondary | ICD-10-CM | POA: Diagnosis not present

## 2021-04-26 ENCOUNTER — Other Ambulatory Visit: Payer: Self-pay

## 2021-04-26 ENCOUNTER — Emergency Department (HOSPITAL_COMMUNITY)
Admission: EM | Admit: 2021-04-26 | Discharge: 2021-04-26 | Disposition: A | Discharge status: Home / Self Care | Payer: Medicaid Other | Attending: Emergency Medicine | Admitting: Emergency Medicine

## 2021-04-26 ENCOUNTER — Encounter (HOSPITAL_COMMUNITY): Payer: Self-pay | Admitting: Emergency Medicine

## 2021-04-26 ENCOUNTER — Emergency Department (HOSPITAL_COMMUNITY): Payer: Medicaid Other

## 2021-04-26 DIAGNOSIS — F17210 Nicotine dependence, cigarettes, uncomplicated: Secondary | ICD-10-CM | POA: Diagnosis not present

## 2021-04-26 DIAGNOSIS — I10 Essential (primary) hypertension: Secondary | ICD-10-CM

## 2021-04-26 DIAGNOSIS — R519 Headache, unspecified: Secondary | ICD-10-CM | POA: Insufficient documentation

## 2021-04-26 DIAGNOSIS — Z20822 Contact with and (suspected) exposure to covid-19: Secondary | ICD-10-CM | POA: Insufficient documentation

## 2021-04-26 LAB — CBC WITH DIFFERENTIAL/PLATELET
Abs Immature Granulocytes: 0.03 10*3/uL (ref 0.00–0.07)
Basophils Absolute: 0.1 10*3/uL (ref 0.0–0.1)
Basophils Relative: 1 %
Eosinophils Absolute: 0.4 10*3/uL (ref 0.0–0.5)
Eosinophils Relative: 5 %
HCT: 40.4 % (ref 36.0–46.0)
Hemoglobin: 14.3 g/dL (ref 12.0–15.0)
Immature Granulocytes: 0 %
Lymphocytes Relative: 29 %
Lymphs Abs: 2.3 10*3/uL (ref 0.7–4.0)
MCH: 33 pg (ref 26.0–34.0)
MCHC: 35.4 g/dL (ref 30.0–36.0)
MCV: 93.3 fL (ref 80.0–100.0)
Monocytes Absolute: 0.7 10*3/uL (ref 0.1–1.0)
Monocytes Relative: 9 %
Neutro Abs: 4.5 10*3/uL (ref 1.7–7.7)
Neutrophils Relative %: 56 %
Platelets: 328 10*3/uL (ref 150–400)
RBC: 4.33 MIL/uL (ref 3.87–5.11)
RDW: 11.7 % (ref 11.5–15.5)
WBC: 8 10*3/uL (ref 4.0–10.5)
nRBC: 0 % (ref 0.0–0.2)

## 2021-04-26 LAB — COMPREHENSIVE METABOLIC PANEL
ALT: 17 U/L (ref 0–44)
AST: 19 U/L (ref 15–41)
Albumin: 3.9 g/dL (ref 3.5–5.0)
Alkaline Phosphatase: 76 U/L (ref 38–126)
Anion gap: 10 (ref 5–15)
BUN: 8 mg/dL (ref 6–20)
CO2: 22 mmol/L (ref 22–32)
Calcium: 8.7 mg/dL — ABNORMAL LOW (ref 8.9–10.3)
Chloride: 105 mmol/L (ref 98–111)
Creatinine, Ser: 0.76 mg/dL (ref 0.44–1.00)
GFR, Estimated: >60 mL/min (ref >60)
Glucose, Bld: 126 mg/dL — ABNORMAL HIGH (ref 70–99)
Potassium: 3.2 mmol/L — ABNORMAL LOW (ref 3.5–5.1)
Sodium: 137 mmol/L (ref 135–145)
Total Bilirubin: 0.2 mg/dL — ABNORMAL LOW (ref 0.3–1.2)
Total Protein: 6.8 g/dL (ref 6.5–8.1)

## 2021-04-26 LAB — RESP PANEL BY RT-PCR (FLU A&B, COVID) ARPGX2
Influenza A by PCR: NEGATIVE
Influenza B by PCR: NEGATIVE
SARS Coronavirus 2 by RT PCR: NEGATIVE

## 2021-04-26 MED ORDER — HALOPERIDOL LACTATE 5 MG/ML IJ SOLN
5.0000 mg | Freq: Once | INTRAMUSCULAR | Status: AC
  Administered 2021-04-26: 5 mg via INTRAMUSCULAR
  Filled 2021-04-26: qty 1

## 2021-04-26 MED ORDER — ACETAMINOPHEN 325 MG PO TABS
650.0000 mg | ORAL_TABLET | Freq: Four times a day (QID) | ORAL | Status: DC | PRN
  Administered 2021-04-26: 650 mg via ORAL
  Filled 2021-04-26: qty 2

## 2021-04-26 NOTE — ED Provider Notes (Signed)
?[facility]ERGENCY DEPT ?[facility] Emergency Department ?Provider Note ?MRN:  [id number]  ?Arrival date & time: 04/26/21    ? ?Chief Complaint   ?Hypertension ?  ?History of Present Illness   ?Lauren Cox is a 46 y.o. year-old female with a history of anxiety presenting to the ED with chief complaint of hypertension. ? ?Elevated blood pressure all day today.  Checked multiple times at home, multiple times at [place].  Feeling some shortness of breath with exertion today, having a bad headache on the left side of the head for the past 2 days, gradual onset.  Denies numbness or weakness to the arms or legs, no nausea vomiting, no leg pain or swelling, no other complaints. ? ?Review of Systems  ?A thorough review of systems was obtained and all systems are negative except as noted in the HPI and PMH.  ? ?Patient's Health History   ? ?Past Medical History:  ?Diagnosis Date  ? Anxiety   ?  ?Past Surgical History:  ?Procedure Laterality Date  ? CERVICAL CONE BIOPSY  2016  ? CHOLECYSTECTOMY  2006  ? OPTIC NERVE SHEATH FENESTRATIAN  02/2018  ? SPINAL FUSION    ?  ?No family history on file.  ?Social History  ? ?Socioeconomic History  ? Marital status: Single  ?  Spouse name: Not on file  ? Number of children: Not on file  ? Years of education: Not on file  ? Highest education level: Not on file  ?Occupational History  ? Not on file  ?Tobacco Use  ? Smoking status: Some Days  ?  Packs/day: 0.50  ?  Types: Cigarettes  ? Smokeless tobacco: Never  ?Vaping Use  ? Vaping Use: Never used  ?Substance and Sexual Activity  ? Alcohol use: Yes  ?  Comment: occ  ? Drug use: No  ? Sexual activity: Yes  ?  Partners: Male  ?  Birth control/protection: None  ?Other Topics Concern  ? Not on file  ?Social History Narrative  ? Not on file  ? ?Social Determinants of Health  ? ?Financial Resource Strain: Not on file  ?Food Insecurity: Not on file  ?Transportation Needs: Not on file  ?Physical Activity: Not on file  ?Stress: Not on file   ?Social Connections: Not on file  ?Intimate Partner Violence: Not on file  ?  ? ?Physical Exam  ? ?Vitals:  ? 04/26/21 0616 04/26/21 0625  ?BP:  (!) 163/92  ?Pulse: 71 76  ?Resp: 17 18  ?Temp:    ?SpO2: 97% 99%  ?  ?CONSTITUTIONAL: Well-appearing, NAD ?NEURO/PSYCH:  Alert and oriented x 3, no focal deficits ?EYES:  eyes equal and reactive ?ENT/NECK:  no LAD, no JVD ?CARDIO: Regular rate, well-perfused, normal S1 and S2 ?PULM:  CTAB no wheezing or rhonchi ?GI/GU:  non-distended, non-tender ?MSK/SPINE:  No gross deformities, no edema ?SKIN:  no rash, atraumatic ? ? ?*Additional and/or pertinent findings included in MDM below ? ?Diagnostic and Interventional Summary  ? ? EKG Interpretation ? ?Date/Time:  Saturday April 26 2021 03:00:07 EDT ?Ventricular Rate:  89 ?PR Interval:  120 ?QRS Duration: 74 ?QT Interval:  394 ?QTC Calculation: 479 ?R Axis:   85 ?Text Interpretation: Normal sinus rhythm Normal ECG When compared with ECG of 16-May-2019 08:32, PREVIOUS ECG IS PRESENT Confirmed by Kennis Carina [phone]) on 04/26/2021 4:37:39 AM ?  ? ?  ? ?Labs Reviewed  ?COMPREHENSIVE METABOLIC PANEL - Abnormal; Notable for the following components:  ?    Result Value  ?  Potassium 3.2 (*)   ? Glucose, Bld 126 (*)   ? Calcium 8.7 (*)   ? Total Bilirubin 0.2 (*)   ? All other components within normal limits  ?RESP PANEL BY RT-PCR (FLU A&B, COVID) [id number]  ?CBC WITH DIFFERENTIAL/PLATELET  ?  ?DG Chest Port 1 View  ?Final Result  ?  ?  ?Medications  ?acetaminophen (TYLENOL) tablet 650 mg (650 mg Oral Given 04/26/21 0301)  ?haloperidol lactate (HALDOL) injection 5 mg (5 mg Intramuscular Given 04/26/21 0537)  ?  ? ?Procedures  /  Critical Care ?Procedures ? ?ED Course and Medical Decision Making  ?Initial Impression and Ddx ?Suspect essential hypertension with benign headache, suspect large anxiety component with symptoms today.  However also considering intracranial bleeding, pneumothorax, pulmonary edema.  Patient is PERC negative, highly  doubt PE.  Providing intramuscular Haldol for patient's fairly significant headache and will reassess.  Obtaining CT head, chest x-ray.  Basic labs reassuring with no signs of endorgan damage.  No chest pain.  EKG is reassuring. ? ?Past medical/surgical history that increases complexity of ED encounter: Anxiety ? ?Interpretation of Diagnostics ?I personally reviewed the Chest Xray and my interpretation is as follows: No obvious opacity or pneumothorax, normal mediastinum ?   ? ? ?Patient Reassessment and Ultimate Disposition/Management ?On reassessment patient is feeling all the way better, headache resolved.  She wishes to go home.  CT head not obtained yet, however patient had a normal neurological exam and the concern for intracranial bleeding or acute process was quite low.  We will hold off on CNS imaging and patient will closely monitor symptoms and follow-up with PCP. ? ?Patient management required discussion with the following services or consulting groups:  None ? ?Complexity of Problems Addressed ?Acute illness or injury that poses threat of life of bodily function ? ?Additional Data Reviewed and Analyzed ?Further history obtained from: ?None ? ?Additional Factors Impacting ED Encounter Risk ?None ? ?Elmer Sow. Pilar Plate, MD ?[facility] Emergency Medicine ?[facility] [email or web address] ? ?Final Clinical Impressions(s) / ED Diagnoses  ? ?  ICD-10-CM   ?1. Hypertension, unspecified type  I10   ?  ?2. Acute nonintractable headache, unspecified headache type  R51.9   ?  ?  ?ED Discharge Orders   ? ? None  ? ?  ?  ? ?Discharge Instructions Discussed with and Provided to Patient:  ? ? ? ?Discharge Instructions   ? ?  ?You were evaluated in the Emergency Department and after careful evaluation, we did not find any emergent condition requiring admission or further testing in the hospital. ? ?Your exam/testing today was overall reassuring.  Recommend follow-up with your primary care doctor to discuss  your symptoms and your blood pressure. ? ?Please return to the Emergency Department if you experience any worsening of your condition.  Thank you for allowing [place] to be a part of your care. ? ? ? ? ? ?  ?Sabas Sous, MD ?04/26/21 0630 ? ?

## 2021-04-26 NOTE — Discharge Instructions (Signed)
You were evaluated in the Emergency Department and after careful evaluation, we did not find any emergent condition requiring admission or further testing in the hospital. ? ?Your exam/testing today was overall reassuring.  Recommend follow-up with your primary care doctor to discuss your symptoms and your blood pressure. ? ?Please return to the Emergency Department if you experience any worsening of your condition.  Thank you for allowing [place] to be a part of your care. ? ?

## 2021-04-26 NOTE — ED Notes (Signed)
Patient verbalizes understanding of d/c instructions. Opportunities for questions and answers were provided. Pt d/c from ED and ambulated to lobby.  

## 2021-04-26 NOTE — ED Triage Notes (Signed)
Pt c/o headache x 2 days and hypertension since 1700 yesterday. Pt denies hx of HTN. ?

## 2021-06-09 ENCOUNTER — Ambulatory Visit (HOSPITAL_COMMUNITY)
Admission: EM | Admit: 2021-06-09 | Discharge: 2021-06-09 | Disposition: A | Discharge status: Home / Self Care | Payer: Medicaid Other | Attending: Psychiatry | Admitting: Psychiatry

## 2021-06-09 DIAGNOSIS — Z79899 Other long term (current) drug therapy: Secondary | ICD-10-CM | POA: Insufficient documentation

## 2021-06-09 DIAGNOSIS — F331 Major depressive disorder, recurrent, moderate: Secondary | ICD-10-CM | POA: Insufficient documentation

## 2021-06-09 NOTE — Discharge Instructions (Addendum)
Safety Plan ?Lauren Cox will reach out to  her , call 911 or call mobile crisis, or go to nearest emergency room if condition worsens or if suicidal thoughts become active ?Patients' will follow up with resources provided  for outpatient psychiatric services (therapy/medication management).  ?The suicide prevention education provided includes the following: ?Suicide risk factors ?Suicide prevention and interventions ?National Suicide Hotline telephone number ?[facility] assessment telephone number ?[facility] Emergency Assistance 911 ?[place] and/or Residential Mobile Crisis Unit telephone number ?Request made of family/significant other to:   ?Remove weapons (e.g., guns, rifles, knives), all items previously/currently identified as safety concern.   ?Remove drugs/medications (over the counter, prescriptions, illicit drugs), all items previously/currently identified as a safety concern.   ?
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
## 2021-06-09 NOTE — ED Provider Notes (Signed)
[facility]al [facility] Medical Screening Exam ? ?Patient Name: Lauren Cox ?MRN: [id number] ?Date of Evaluation: 06/09/21 ?Chief Complaint:   ?Diagnosis:  ?Final diagnoses:  ?MDD (major depressive disorder), recurrent episode, moderate (HCC)  ? ? ?History of Present illness: Lauren Cox is a 46 y.o. female patient presented to [facility] as a walk in  alone requesting to be restarted on Lexapro and Trazodone.  ? ?Lauren Cox, 46 y.o., female patient seen face to face by this provider, consulted with Dr. Bronwen Betters; and chart reviewed on 06/09/21.  Patient reports she has a past psychiatric history of MDD, GAD, and PTSD. She had services in place with [facility] [place] but was told they no longer take her insurance. She has been off of her medications (Lexapro and Trazodone) since December 2022. She denies any substance use.  ? ?During evaluation Lauren Cox is sitting in the assessment room in no acute distress. She well groomed and makes good eye contact. She is alert/oriented x 4 and cooperative. She has normal speech. She endorses increased depression and anxiety with feelings of helplessness, low motivation, increased tearfulness, and decreased focus.  She has a depressed affect.  Reports her symptoms have worsened since December 2022 at that time her psychiatric office closed and she was left without a provider.  Reports she has made multiple attempts to to gain services at [facility] [place] and with [facility] residential.  States her medications are her life support.  Reports that she does well when she is taking her medications.  She does not identify any recent stressors/triggers other than being off of her medications.  Objectively she does not appear to be responding to internal/external stimuli.  She denies AVH/SI/HI.  She contracts for safety.  She denies paranoid and delusional thought content.  She is logical and has answered questions appropriately. ? ?She was  provided with outpatient psychiatric resources. Patient agreed to contact [facility] today.  ? ?At this time Lauren Cox is educated and verbalizes understanding of mental health resources and other crisis services in the community. She is instructed to call 911 and present to the nearest emergency room should she experience any suicidal/homicidal ideation, auditory/visual/hallucinations, or detrimental worsening of her mental health condition.  She was a also advised by Clinical research associate that she could call the toll-free phone on insurance card to assist with identifying in network counselors and agencies or number on back of Medicaid card to speak with care coordinator ?  ? ?Psychiatric Specialty Exam ? ?Presentation  ?General Appearance:Appropriate for Environment ? ?Eye Contact:Good ? ?Speech:Clear and Coherent; Normal Rate ? ?Speech Volume:Normal ? ?Handedness:Right ? ? ?Mood and Affect  ?Mood:Anxious; Depressed ? ?Affect:Congruent; Tearful ? ? ?Thought Process  ?Thought Processes:Coherent ? ?Descriptions of Associations:Intact ? ?Orientation:Full (Time, Place and Person) ? ?Thought Content:Logical ?   Hallucinations:None ? ?Ideas of Reference:None ? ?Suicidal Thoughts:No ? ?Homicidal Thoughts:No ? ? ?Sensorium  ?Memory:Immediate Good; Recent Good; Remote Good ? ?Judgment:Good ? ?Insight:Good ? ? ?Executive Functions  ?Concentration:Good ? ?Attention Span:Good ? ?Recall:Good ? ?Fund of Knowledge:Good ? ?Language:Good ? ? ?Psychomotor Activity  ?Psychomotor Activity:Normal ? ? ?Assets  ?Assets:Communication Skills; Desire for Improvement; Financial Resources/Insurance; Housing; Physical Health; Resilience; Social Support ? ? ?Sleep  ?Sleep:Good ? ?Number of hours: No data recorded ? ?No data recorded ? ?Physical Exam: ?Physical Exam ?Vitals and nursing note reviewed.  ?Constitutional:   ?   General: She is not in acute distress. ?   Appearance: Normal appearance.  She is not ill-appearing.  ?HENT:  ?   Head:  Normocephalic.  ?Eyes:  ?   General:     ?   Right eye: No discharge.     ?   Left eye: No discharge.  ?   Conjunctiva/sclera: Conjunctivae normal.  ?Cardiovascular:  ?   Rate and Rhythm: Normal rate.  ?Pulmonary:  ?   Effort: Pulmonary effort is normal.  ?Musculoskeletal:     ?   General: Normal range of motion.  ?   Cervical back: Normal range of motion.  ?Skin: ?   Coloration: Skin is not jaundiced or pale.  ?Neurological:  ?   Mental Status: She is alert and oriented to person, place, and time.  ?Psychiatric:     ?   Attention and Perception: Attention and perception normal.     ?   Mood and Affect: Mood is anxious and depressed. Affect is tearful.     ?   Speech: Speech normal.     ?   Behavior: Behavior normal. Behavior is cooperative.     ?   Thought Content: Thought content normal.     ?   Cognition and Memory: Cognition normal.     ?   Judgment: Judgment normal.  ? ?Review of Systems  ?Constitutional: Negative.   ?HENT: Negative.    ?Eyes: Negative.   ?Respiratory: Negative.    ?Cardiovascular: Negative.   ?Musculoskeletal: Negative.   ?Skin: Negative.   ?Neurological: Negative.   ?Psychiatric/Behavioral:  Positive for depression. The patient is nervous/anxious.   ?Blood pressure (!) 181/107, pulse 91, temperature 98.5 ?F (36.9 ?C), temperature source Tympanic, resp. rate 18, height 5\' 5"  (1.651 m), weight 230 lb (104.3 kg), SpO2 100 %. Body mass index is 38.27 kg/m?. ? ?Musculoskeletal: ?Strength & Muscle Tone: within normal limits ?Gait & Station: normal ?Patient leans: N/A ? ? ?[facility] M[facility] Discharge Disposition for Follow up and Recommendations: ?Based on my evaluation the patient does not appear to have an emergency medical condition and can be discharged with resources and follow up care in outpatient services for Medication Management and Individual Therapy ? ?Discharge patient. ? ?Out patient psychiatric resources provided.  ? ?No evidence of imminent risk to self or others at present.    ?Patient does  not meet criteria for psychiatric inpatient admission. ?Discussed crisis plan, support from social network, calling 911, coming to the Emergency Department, and calling Suicide Hotline.  ? ? ?[facility], NP ?06/09/2021, 10:42 AM ? ?

## 2021-06-17 DIAGNOSIS — R030 Elevated blood-pressure reading, without diagnosis of hypertension: Secondary | ICD-10-CM | POA: Insufficient documentation

## 2022-07-31 IMAGING — DX DG CHEST 1V PORT
1 series · 1 of 1 positions shown · non-contrast
Comparison: 05/30/2018

CLINICAL DATA: Hypertension and headache.  Shortness of breath.

EXAM:
PORTABLE CHEST 1 VIEW

[chest]
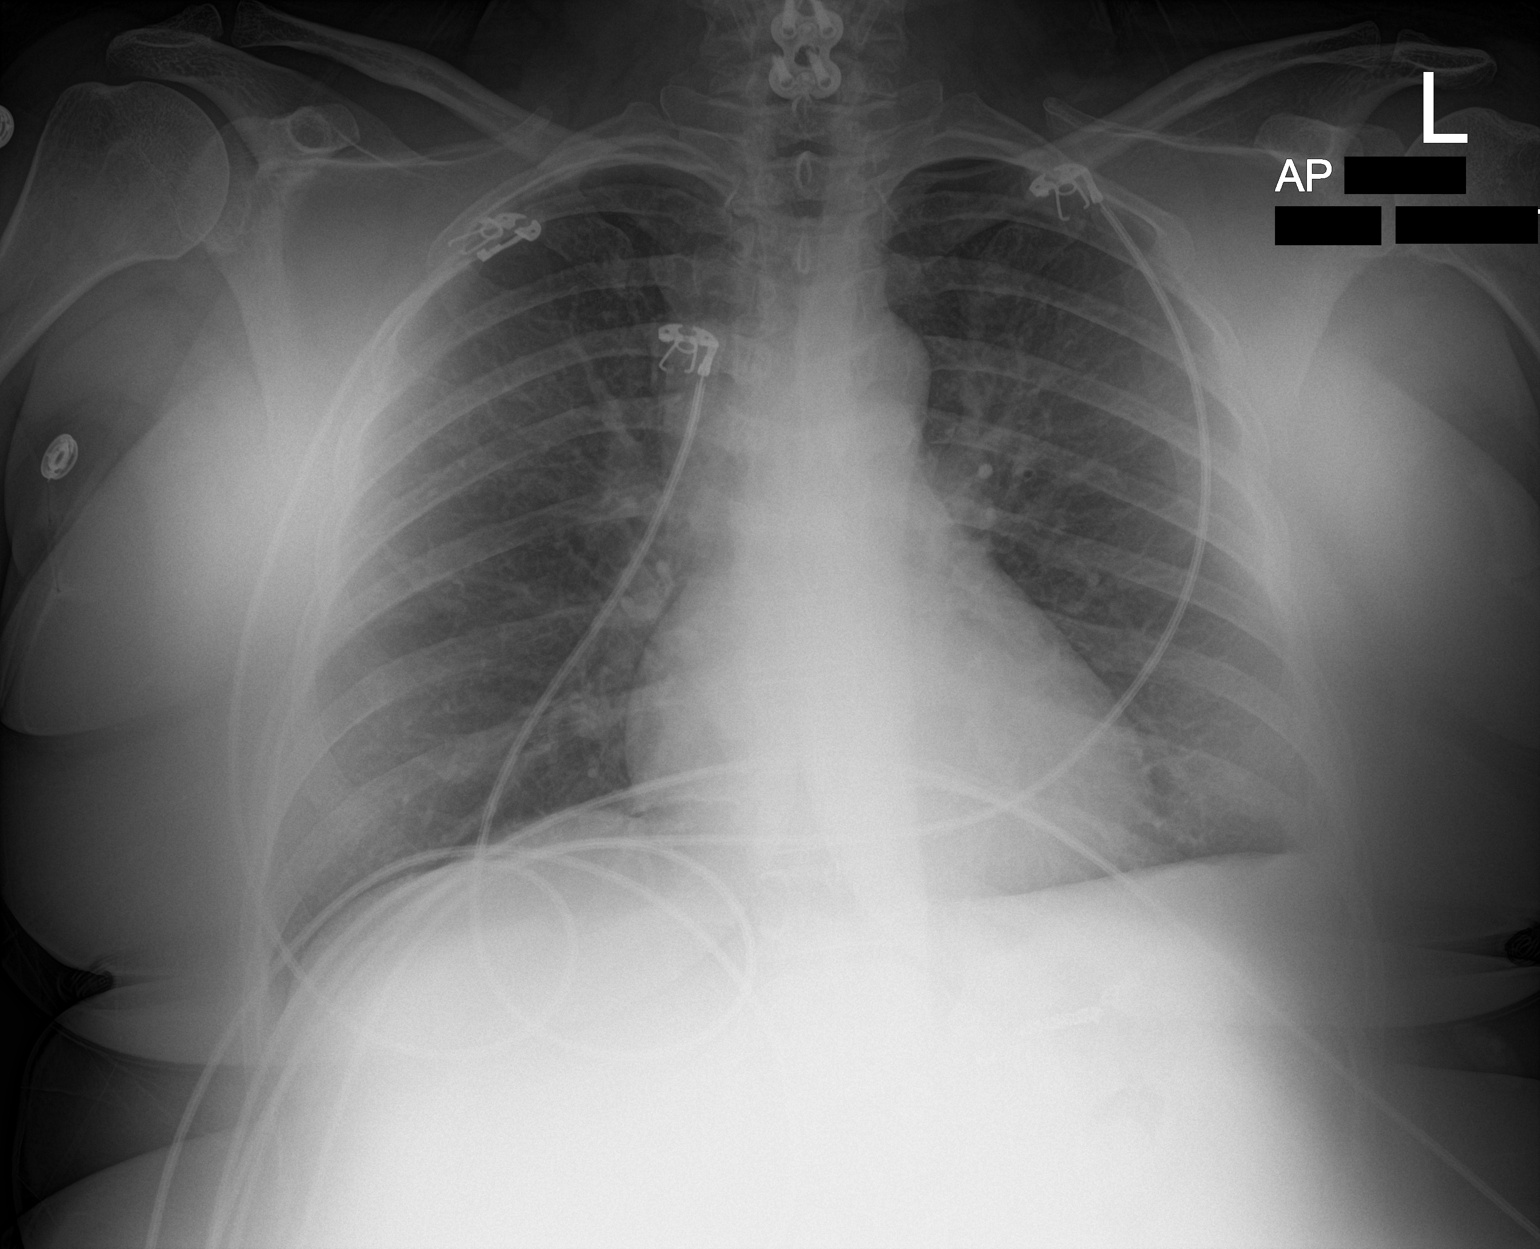

[1 of 1 positions shown; findings below may reference images not displayed]

FINDINGS: 3690 hours. The lungs are clear without focal pneumonia, edema,
pneumothorax or pleural effusion. The cardiopericardial silhouette
is within normal limits for size. The visualized bony structures of
the thorax are unremarkable. Telemetry leads overlie the chest.
IMPRESSION: No active disease.

## 2023-03-16 ENCOUNTER — Ambulatory Visit (HOSPITAL_BASED_OUTPATIENT_CLINIC_OR_DEPARTMENT_OTHER)
Admission: EM | Admit: 2023-03-16 | Discharge: 2023-03-16 | Disposition: A | Discharge status: Home / Self Care | Payer: Medicaid Other

## 2023-03-16 ENCOUNTER — Encounter (HOSPITAL_BASED_OUTPATIENT_CLINIC_OR_DEPARTMENT_OTHER): Payer: Self-pay | Admitting: Family Medicine

## 2023-03-16 DIAGNOSIS — M5412 Radiculopathy, cervical region: Secondary | ICD-10-CM | POA: Diagnosis not present

## 2023-03-16 DIAGNOSIS — R200 Anesthesia of skin: Secondary | ICD-10-CM | POA: Diagnosis not present

## 2023-03-16 DIAGNOSIS — R202 Paresthesia of skin: Secondary | ICD-10-CM

## 2023-03-16 DIAGNOSIS — M542 Cervicalgia: Secondary | ICD-10-CM

## 2023-03-16 NOTE — Discharge Instructions (Addendum)
Given the patient's previous neck surgery and secondary neurologic symptoms, I believe her neck pain is not simple torticollis.  She needs to go to an emergency room where she could get advanced imaging, probably an MRI.  After some discussion she agreed that a family member would take her to the emergency room.  She may want to go to the emergency room at [facility].  She does not want to go to the emergency room in [place].  I was clear that she should not go back to an urgent care as advanced imaging is not available.  Additionally the emergency room and [facility] has ultrasound and advanced imaging but they are not able to care for every single condition so advised her that they might still recommend transfer to [facility] health and she is aware of that.

## 2023-03-16 NOTE — ED Provider Notes (Signed)
[facility]    CSN: [id number] Arrival date & time: 03/16/23  1519      History   Chief Complaint No chief complaint on file.   HPI Lauren Cox is a 48 y.o. female.   Patient had cervical vertebral fusion 15 years ago and still has a rod in the area.  2 weeks ago she woke up with a very stiff neck.  She has done ice, exercises, heat.  She has not seen anybody and it is getting worse not better with certain movements of her right arm or neck she has numbness that shoots from her neck across her shoulder under her arm or she has numbness and tingling as some of the fingers in her right hand..  She is now developing numbness and tingling in some of the toes of her left foot.  She can only hold her head in certain directions.  She is not able to sleep due to chronic pain.  She is moving every 10 to 30 minutes due to pain.  She cannot get in with neurosurgery for several weeks.  She spoke with primary care and they encouraged her to go to the emergency room, in case she needed advanced imaging.  She came here because she thought we had advanced imaging.     Past Medical History:  Diagnosis Date   Anxiety     There are no active problems to display for this patient.   Past Surgical History:  Procedure Laterality Date   CERVICAL CONE BIOPSY  2016   CHOLECYSTECTOMY  2006   OPTIC NERVE SHEATH FENESTRATIAN  02/2018   SPINAL FUSION      OB History     Gravida  4   Para  2   Term      Preterm      AB  2   Living  2      SAB  2   IAB      Ectopic      Multiple      Live Births  2            Home Medications    Prior to Admission medications   Medication Sig Start Date End Date Taking? Authorizing Provider  acetaminophen (TYLENOL) 500 MG tablet Take 1,000 mg by mouth every 6 (six) hours as needed for moderate pain or headache.    [provider]  EPINEPHrine 0.3 mg/0.3 mL IJ SOAJ injection Inject 0.3 mg into the muscle as needed for  anaphylaxis. 07/23/12   [provider]  escitalopram (LEXAPRO) 20 MG tablet Take 20 mg by mouth daily.    [provider]  FIBER ADULT GUMMIES PO Take 1 tablet by mouth in the morning and at bedtime.    [provider]  ibuprofen (ADVIL) 200 MG tablet Take 800 mg by mouth every 6 (six) hours as needed for moderate pain or headache.    [provider]  Multiple Vitamin (MULTIVITAMIN) tablet Take 1 tablet by mouth daily.    [provider]  olmesartan (BENICAR) 40 MG tablet Take 40 mg by mouth every morning.    [provider]    Family History History reviewed. No pertinent family history.  Social History Social History   Tobacco Use   Smoking status: Some Days    Current packs/day: 0.50    Types: Cigarettes   Smokeless tobacco: Never  Vaping Use   Vaping status: Never Used  Substance Use Topics   Alcohol  use: Yes    Comment: occ   Drug use: No     Allergies   Sulfa antibiotics, Fish-derived products, and Penicillins   Review of Systems Review of Systems  Constitutional:  Negative for chills and fever.  HENT:  Negative for ear pain and sore throat.   Eyes:  Negative for pain and visual disturbance.  Respiratory:  Negative for cough and shortness of breath.   Cardiovascular:  Negative for chest pain and palpitations.  Gastrointestinal:  Negative for abdominal pain, constipation, diarrhea, nausea and vomiting.  Genitourinary:  Negative for dysuria and hematuria.  Musculoskeletal:  Positive for neck pain. Negative for arthralgias and back pain.  Skin:  Negative for color change and rash.  Neurological:  Positive for weakness and numbness. Negative for seizures and syncope.  All other systems reviewed and are negative.    Physical Exam Triage Vital Signs ED Triage Vitals  Encounter Vitals Group     BP 03/16/23 1549 (!) 155/68     Systolic BP Percentile --      Diastolic BP Percentile --      Pulse Rate 03/16/23  1549 (!) 107     Resp 03/16/23 1549 18     Temp 03/16/23 1549 98.9 F (37.2 C)     Temp Source 03/16/23 1549 Oral     SpO2 03/16/23 1549 98 %     Weight --      Height --      Head Circumference --      Peak Flow --      Pain Score 03/16/23 1546 10     Pain Loc --      Pain Education --      Exclude from Growth Chart --    No data found.  Updated Vital Signs BP (!) 155/68 (BP Location: Right Arm)   Pulse (!) 107   Temp 98.9 F (37.2 C) (Oral)   Resp 18   LMP 09/18/2018 (Exact Date)   SpO2 98%   Visual Acuity Right Eye Distance:   Left Eye Distance:   Bilateral Distance:    Right Eye Near:   Left Eye Near:    Bilateral Near:     Physical Exam Vitals and nursing note reviewed.  Constitutional:      General: She is not in acute distress.    Appearance: She is well-developed. She is ill-appearing. She is not toxic-appearing.  HENT:     Head: Normocephalic and atraumatic.     Right Ear: Hearing, tympanic membrane, ear canal and external ear normal.     Left Ear: Hearing, tympanic membrane, ear canal and external ear normal.     Nose: No congestion or rhinorrhea.     Right Sinus: No maxillary sinus tenderness or frontal sinus tenderness.     Left Sinus: No maxillary sinus tenderness or frontal sinus tenderness.     Mouth/Throat:     Lips: Pink.     Mouth: Mucous membranes are moist.     Pharynx: Uvula midline. No oropharyngeal exudate or posterior oropharyngeal erythema.     Tonsils: No tonsillar exudate.  Eyes:     Conjunctiva/sclera: Conjunctivae normal.     Pupils: Pupils are equal, round, and reactive to light.  Cardiovascular:     Rate and Rhythm: Normal rate and regular rhythm.     Heart sounds: S1 normal and S2 normal. No murmur heard. Pulmonary:     Effort: Pulmonary effort is normal. No respiratory distress.     Breath  sounds: Normal breath sounds. No decreased breath sounds, wheezing, rhonchi or rales.  Abdominal:     Palpations: Abdomen is soft.      Tenderness: There is no abdominal tenderness.  Musculoskeletal:        General: No swelling.     Cervical back: Neck supple. Torticollis present. No rigidity. Pain with movement, spinous process tenderness and muscular tenderness present. Decreased range of motion.  Skin:    General: Skin is warm and dry.     Capillary Refill: Capillary refill takes less than 2 seconds.     Findings: No rash.  Neurological:     Mental Status: She is alert and oriented to person, place, and time.     Comments: Reports numbness in her right arm and hand and in her left foot.  Psychiatric:        Mood and Affect: Mood normal.      UC Treatments / Results  Labs (all labs ordered are listed, but only abnormal results are displayed) Labs Reviewed - No data to display  EKG   Radiology No results found.  Procedures Procedures (including critical care time)  Medications Ordered in UC Medications - No data to display  Initial Impression / Assessment and Plan / UC Course  I have reviewed the triage vital signs and the nursing notes.  Pertinent labs & imaging results that were available during my care of the patient were reviewed by me and considered in my medical decision making (see chart for details).     Encouraged to go to an emergency room for further workup of her neck pain and secondary numbness and tingling of fingers hands and feet.  I felt that given her history and symptoms that this was more than just simple torticollis and she may need an MRI for further workup.  Encouraged to go to the local emergency room.  She would prefer not to go to [facility].  She has decided to go to the freestanding emergency room at [facility].  I explained that they could get advanced imaging and/or ultrasound if needed but that they would decide what was needed and there is always a possibility they still may feel like she needs to go to a emergency room associated with the hospital. Final  Clinical Impressions(s) / UC Diagnoses   Final diagnoses:  Neck pain on right side  Cervical radiculopathy  Numbness and tingling of right arm  Numbness of left foot     Discharge Instructions      Given the patient's previous neck surgery and secondary neurologic symptoms, I believe her neck pain is not simple torticollis.  She needs to go to an emergency room where she could get advanced imaging, probably an MRI.  After some discussion she agreed that a family member would take her to the emergency room.  She may want to go to the emergency room at [facility].  She does not want to go to the emergency room in [place].  I was clear that she should not go back to an urgent care as advanced imaging is not available.  Additionally the emergency room and [facility] has ultrasound and advanced imaging but they are not able to care for every single condition so advised her that they might still recommend transfer to [facility] health and she is aware of that.    ED Prescriptions   None    PDMP not reviewed this encounter.   Prescilla Sours, FNP 03/16/23 [phone]

## 2023-03-16 NOTE — ED Triage Notes (Signed)
Pt reports when she woke up 2 weeks ago she could not move her neck. Pt reports she had a fusion in her neck 15 yr ago.

## 2023-03-29 DIAGNOSIS — M47812 Spondylosis without myelopathy or radiculopathy, cervical region: Secondary | ICD-10-CM | POA: Insufficient documentation

## 2023-03-29 DIAGNOSIS — M5412 Radiculopathy, cervical region: Secondary | ICD-10-CM | POA: Insufficient documentation

## 2023-04-15 DIAGNOSIS — M7918 Myalgia, other site: Secondary | ICD-10-CM | POA: Insufficient documentation

## 2023-04-19 DIAGNOSIS — M542 Cervicalgia: Secondary | ICD-10-CM | POA: Insufficient documentation

## 2023-04-19 DIAGNOSIS — R519 Headache, unspecified: Secondary | ICD-10-CM | POA: Insufficient documentation

## 2023-05-04 DIAGNOSIS — M5481 Occipital neuralgia: Secondary | ICD-10-CM | POA: Insufficient documentation

## 2023-05-12 ENCOUNTER — Ambulatory Visit (HOSPITAL_BASED_OUTPATIENT_CLINIC_OR_DEPARTMENT_OTHER): Admitting: Student

## 2023-05-27 DIAGNOSIS — I10 Essential (primary) hypertension: Secondary | ICD-10-CM | POA: Insufficient documentation

## 2023-05-27 DIAGNOSIS — F321 Major depressive disorder, single episode, moderate: Secondary | ICD-10-CM | POA: Insufficient documentation

## 2023-05-27 DIAGNOSIS — E782 Mixed hyperlipidemia: Secondary | ICD-10-CM | POA: Insufficient documentation

## 2023-06-15 DIAGNOSIS — F410 Panic disorder [episodic paroxysmal anxiety] without agoraphobia: Secondary | ICD-10-CM | POA: Insufficient documentation

## 2023-08-17 DIAGNOSIS — D649 Anemia, unspecified: Secondary | ICD-10-CM | POA: Insufficient documentation

## 2023-08-17 DIAGNOSIS — Z8742 Personal history of other diseases of the female genital tract: Secondary | ICD-10-CM | POA: Insufficient documentation

## 2023-09-29 ENCOUNTER — Ambulatory Visit (HOSPITAL_BASED_OUTPATIENT_CLINIC_OR_DEPARTMENT_OTHER): Admitting: Student

## 2023-09-29 ENCOUNTER — Other Ambulatory Visit (HOSPITAL_BASED_OUTPATIENT_CLINIC_OR_DEPARTMENT_OTHER): Payer: Self-pay

## 2023-09-29 ENCOUNTER — Encounter (HOSPITAL_BASED_OUTPATIENT_CLINIC_OR_DEPARTMENT_OTHER): Payer: Self-pay | Admitting: Student

## 2023-09-29 VITALS — BP 130/75 | HR 117 | Temp 99.1°F | Resp 16 | Ht 62.21 in | Wt 211.1 lb

## 2023-09-29 DIAGNOSIS — F4310 Post-traumatic stress disorder, unspecified: Secondary | ICD-10-CM

## 2023-09-29 DIAGNOSIS — F331 Major depressive disorder, recurrent, moderate: Secondary | ICD-10-CM | POA: Diagnosis not present

## 2023-09-29 DIAGNOSIS — E785 Hyperlipidemia, unspecified: Secondary | ICD-10-CM

## 2023-09-29 DIAGNOSIS — I10 Essential (primary) hypertension: Secondary | ICD-10-CM

## 2023-09-29 DIAGNOSIS — Z7689 Persons encountering health services in other specified circumstances: Secondary | ICD-10-CM

## 2023-09-29 DIAGNOSIS — F411 Generalized anxiety disorder: Secondary | ICD-10-CM

## 2023-09-29 DIAGNOSIS — Z91013 Allergy to seafood: Secondary | ICD-10-CM

## 2023-09-29 DIAGNOSIS — L209 Atopic dermatitis, unspecified: Secondary | ICD-10-CM

## 2023-09-29 MED ORDER — WEGOVY 0.25 MG/0.5ML ~~LOC~~ SOAJ
0.2500 mg | SUBCUTANEOUS | 0 refills | Status: DC
  Filled 2023-09-29: qty 2, 28d supply, fill #0

## 2023-09-29 MED ORDER — TRIAMCINOLONE ACETONIDE 0.1 % EX CREA
1.0000 | TOPICAL_CREAM | Freq: Two times a day (BID) | CUTANEOUS | 0 refills | Status: DC
  Filled 2023-09-29: qty 80, 30d supply, fill #0

## 2023-09-29 MED ORDER — EPINEPHRINE 0.3 MG/0.3ML IJ SOAJ
0.3000 mg | INTRAMUSCULAR | 3 refills | Status: AC | PRN
  Filled 2023-09-29: qty 2, 2d supply, fill #0

## 2023-09-29 NOTE — Patient Instructions (Addendum)
 It was nice to see you today!  As we discussed in clinic:  [facility] Address: [address], [place], [place] [address] Phone: [phone]  If you have any problems before your next visit feel free to message me via MyChart (minor issues or questions) or call the office, otherwise you may reach out to schedule an office visit.  Thank you! Chelbie Jarnagin, PA-C

## 2023-09-29 NOTE — Progress Notes (Signed)
 New Patient Office Visit  Subjective    Patient ID: Lauren Cox, female    DOB: May 02, 1975  Age: 48 y.o. MRN: [id number]  CC:  Chief Complaint  Patient presents with   Establish Care    Here to establish care.   Dermatology    Would like dermatology referral.   Weight Loss    Would like to discuss weight loss.    Mental health    Needs mental health. Thinks she had serotonin syndrome from lexapro  40mg     Discussed the use of AI scribe software for clinical note transcription with the patient, who gave verbal consent to proceed.  History of Present Illness   Lauren Cox is a 48 year old female with PTSD, general anxiety disorder, and major depressive disorder who presents for mental health support and weight loss management.  She is seeking to establish care with a new healthcare provider and obtain referrals for mental health support. Her PTSD, general anxiety disorder, and major depressive disorder have been recurrent, exacerbated by personal traumas such as the death of her daughter from leukemia, a divorce, and the COVID-19 pandemic. She has struggled to find appropriate mental health care due to [facility] insurance limitations.  She reports significant weight gain over the past few years, attributed to her mental health struggles and life events. Previously weighing 153 pounds five years ago, she has since gained weight and was diagnosed with hypertension and hyperlipidemia. She has lost 30 pounds independently but is struggling to lose more. She is on HCTZ and rosuvastatin for hypertension and hyperlipidemia, and has concerns about medication side effects, including symptoms she felt may have been related to her Lexapro  dose being increased.  Her eczema has worsened recently, becoming itchy and photosensitive, affecting sun-exposed areas. She uses hydrocortisone and emollients for symptom management and suspects the exacerbation may be related to medication changes,  particularly the increase in Lexapro  dosage.  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 She avoids benzodiazepines due to their similarity to alcohol and has not filled prescriptions for them. Currently, she takes Lexapro , which was increased to 40 mg but tapered back to 10 mg due to adverse effects. She was switched to Celexa  but is hesitant to continue changing medications without proper management.  She has a history of ADD diagnosed at a young age but was not treated with medication. Currently, she experiences agoraphobia, impacting her ability to leave the house and use her gym membership. She is also experiencing menopause symptoms, including amenorrhea and weight gain, which she finds challenging to manage.      Screenings:  Colon Cancer: discuss at next visit Lung Cancer: current smoker- no CT indicated until age 83 Breast Cancer: discuss at next visit Cervical Cancer: discuss at next visit Diabetes: not indicated HLD: indicated      09/29/2023    1:39 PM  PHQ9 SCORE ONLY  PHQ-9 Total Score 24      09/29/2023    1:40 PM  GAD 7 : Generalized Anxiety Score  Nervous, Anxious, on Edge 3  Control/stop worrying 2  Worry too much - different things 3  Trouble relaxing 3  Restless 3  Easily annoyed or irritable 2  Afraid - awful might happen 2  Total GAD 7 Score 18  Anxiety Difficulty Extremely difficult   Outpatient Encounter Medications as of 09/29/2023  Medication Sig   acetaminophen  (TYLENOL ) 500 MG tablet Take 1,000 mg by mouth every 6 (  six) hours as needed for moderate pain or headache.   olmesartan-hydrochlorothiazide (BENICAR HCT) 40-25 MG tablet Take 1 tablet by mouth daily.   semaglutide -weight management (WEGOVY ) 0.25 MG/0.5ML SOAJ SQ injection Inject 0.25 mg into the skin once a week.   triamcinolone  cream (KENALOG ) 0.1 % Apply 1 Application topically 2 (two) times daily for up to 7 days on and then take 7 days off.    [DISCONTINUED] EPINEPHrine  0.3 mg/0.3 mL IJ SOAJ injection Inject 0.3 mg into the muscle as needed for anaphylaxis.   [DISCONTINUED] FIBER ADULT GUMMIES PO Take 1 tablet by mouth in the morning and at bedtime.   [DISCONTINUED] ibuprofen (ADVIL) 200 MG tablet Take 800 mg by mouth every 6 (six) hours as needed for moderate pain or headache.   [DISCONTINUED] Multiple Vitamin (MULTIVITAMIN) tablet Take 1 tablet by mouth daily.   [DISCONTINUED] olmesartan (BENICAR) 40 MG tablet Take 40 mg by mouth every morning.   EPINEPHrine  0.3 mg/0.3 mL IJ SOAJ injection Inject 0.3 mg into the muscle as needed for anaphylaxis.   [DISCONTINUED] escitalopram  (LEXAPRO ) 20 MG tablet Take 20 mg by mouth daily.   No facility-administered encounter medications on file as of 09/29/2023.    Past Medical History:  Diagnosis Date   Anxiety    Hyperlipidemia    Hypertension    Major depressive disorder     Past Surgical History:  Procedure Laterality Date   CERVICAL CONE BIOPSY  2016   CHOLECYSTECTOMY  2006   SPINAL FUSION      Family History  Problem Relation Age of Onset   Drug abuse Mother    Breast cancer Mother        NEG FOR BRCA GENE   Drug abuse Father    Healthy Brother    Healthy Brother    Healthy Brother    Healthy Brother    Leukemia Daughter    Asthma Son     Social History   Socioeconomic History   Marital status: Single    Spouse name: Not on file   Number of children: 2   Years of education: Not on file   Highest education level: Bachelor's degree (e.g., BA, AB, BS)  Occupational History   Not on file  Tobacco Use   Smoking status: Some Days    Current packs/day: 0.50    Average packs/day: 0.5 packs/day for 3.7 years (1.8 ttl pk-yrs)    Types: Cigarettes    Start date: 2022    Passive exposure: Past   Smokeless tobacco: Never  Vaping Use   Vaping status: Never Used  Substance and Sexual Activity   Alcohol use: Not Currently    Comment: occ   Drug use: Yes    Types:  Marijuana    Comment: SOME DAYS   Sexual activity: Yes    Partners: Male    Birth control/protection: None  Other Topics Concern   Not on file  Social History Narrative   Not on file   Social Drivers of Health   Financial Resource Strain: Low Risk  (09/23/2023)   Overall Financial Resource Strain (CARDIA)    Difficulty of Paying Living Expenses: Not very hard  Food Insecurity: No Food Insecurity (09/23/2023)   Hunger Vital Sign    Worried About Running Out of Food in the Last Year: Never true    Ran Out of Food in the Last Year: Never true  Transportation Needs: No Transportation Needs (09/23/2023)   PRAPARE - Transportation    Lack of Transportation (  Medical): No    Lack of Transportation (Non-Medical): No  Physical Activity: Insufficiently Active (09/23/2023)   Exercise Vital Sign    Days of Exercise per Week: 5 days    Minutes of Exercise per Session: 20 min  Stress: Stress Concern Present (09/23/2023)   [employer] of Occupational Health - Occupational Stress Questionnaire    Feeling of Stress: Very much  Social Connections: Socially Isolated (09/23/2023)   Social Connection and Isolation Panel    Frequency of Communication with Friends and Family: Never    Frequency of Social Gatherings with Friends and Family: Never    Attends Religious Services: Never    Database administrator or Organizations: No    Attends Engineer, structural: Not on file    Marital Status: Living with partner  Intimate Partner Violence: Not At Risk (09/29/2023)   Humiliation, Afraid, Rape, and Kick questionnaire    Fear of Current or Ex-Partner: No    Emotionally Abused: No    Physically Abused: No    Sexually Abused: No    ROS  Per HPI      Objective    BP 130/75   Pulse (!) 117   Temp 99.1 F (37.3 C) (Oral)   Resp 16   Ht 5' 2.21 (1.58 m)   Wt 211 lb 1.6 oz (95.8 kg)   LMP 09/18/2018 (Exact Date)   SpO2 98%   BMI 38.36 kg/m   Physical Exam Constitutional:       General: She is not in acute distress.    Appearance: Normal appearance. She is not ill-appearing.  HENT:     Head: Normocephalic and atraumatic.     Nose: Nose normal.  Eyes:     General: No scleral icterus.    Conjunctiva/sclera: Conjunctivae normal.  Cardiovascular:     Rate and Rhythm: Normal rate and regular rhythm.     Heart sounds: Normal heart sounds. No murmur heard.    No friction rub.  Pulmonary:     Effort: Pulmonary effort is normal. No respiratory distress.     Breath sounds: Normal breath sounds. No wheezing, rhonchi or rales.  Musculoskeletal:        General: Normal range of motion.  Skin:    General: Skin is warm and dry.     Coloration: Skin is not jaundiced or pale.     Comments: Maculopapular rash of BLUE and chest. Pruritic. Negative for dermatographia.  Neurological:     General: No focal deficit present.     Mental Status: She is alert.  Psychiatric:        Mood and Affect: Mood normal.        Behavior: Behavior normal.     Last CBC Lab Results  Component Value Date   WBC 8.0 04/26/2021   HGB 14.3 04/26/2021   HCT 40.4 04/26/2021   MCV 93.3 04/26/2021   MCH 33.0 04/26/2021   RDW 11.7 04/26/2021   PLT 328 04/26/2021   Last metabolic panel Lab Results  Component Value Date   GLUCOSE 126 (H) 04/26/2021   NA 137 04/26/2021   K 3.2 (L) 04/26/2021   CL 105 04/26/2021   CO2 22 04/26/2021   BUN 8 04/26/2021   CREATININE 0.76 04/26/2021   GFRNONAA >60 04/26/2021   CALCIUM 8.7 (L) 04/26/2021   PROT 6.8 04/26/2021   ALBUMIN 3.9 04/26/2021   BILITOT 0.2 (L) 04/26/2021   ALKPHOS 76 04/26/2021   AST 19 04/26/2021   ALT 17 04/26/2021  ANIONGAP 10 04/26/2021   Last lipids No results found for: CHOL, HDL, LDLCALC, LDLDIRECT, TRIG, CHOLHDL Last hemoglobin A1c No results found for: HGBA1C      Assessment & Plan:   Assessment and Plan    Obesity with comorbid hypertension and hyperlipidemia Chronic issue, not at goal. Obesity  with BMI over 35, hypertension, and hyperlipidemia. Weight gain linked to life stressors including bereavement and divorce. Blood pressure is well-controlled with medication, but frequent urination and sleep disturbances may be due to HCTZ. She has lost 30 pounds independently but struggles to lose more weight. Anxiety may impact blood pressure and weight. - Attempt to prescribe Wegovy  for weight loss, contingent on insurance approval. - Continue current antihypertensive regimen including HCTZ. - Reassess blood pressure and weight management in follow-up visit.  Major depressive disorder, recurrent with comorbid generalized anxiety disorder and post-traumatic stress disorder (PTSD) Chronic, not at goal. Recurrent major depressive disorder with generalized anxiety disorder and PTSD. Medication changes previously led to possible serotonin syndrome. Current regimen includes Lexapro , reduced by her due to side effects. Dissatisfaction with previous psychiatric care and difficulty accessing mental health services due to Medicaid limitations. Anxiety exacerbated by life stressors and agoraphobia. - Provide contact information for [facility] in [place] for mental health services that accept Medicaid. - Encourage in-person appointments for mental health care if possible. - Plan follow-up visit in four weeks to assess mental health progress and medication efficacy.  Atopic dermatitis Chronic, with exacerbation. Itchy rash on sun-exposed areas, possibly exacerbated by medication changes or dry skin. Current flare-up of eczema is socially limiting. No linear rash between finger tips. - Prescribe triamcinolone  cream to be applied to affected areas twice daily for up to seven days. - Advise application of Eucerin cream after triamcinolone  application. - Reassess skin condition in follow-up visit.     Fish Allergy - Refill epipen   Return in about 4 weeks (around 10/27/2023) for weight loss/Rash.   Amal Saiki T  Telitha Plath, PA-C

## 2023-09-30 ENCOUNTER — Telehealth (HOSPITAL_BASED_OUTPATIENT_CLINIC_OR_DEPARTMENT_OTHER): Payer: Self-pay

## 2023-09-30 ENCOUNTER — Other Ambulatory Visit (HOSPITAL_BASED_OUTPATIENT_CLINIC_OR_DEPARTMENT_OTHER): Payer: Self-pay

## 2023-09-30 ENCOUNTER — Encounter (HOSPITAL_BASED_OUTPATIENT_CLINIC_OR_DEPARTMENT_OTHER): Payer: Self-pay

## 2023-09-30 NOTE — Telephone Encounter (Signed)
 See message regarding wegovy  PA

## 2023-10-04 ENCOUNTER — Other Ambulatory Visit (HOSPITAL_BASED_OUTPATIENT_CLINIC_OR_DEPARTMENT_OTHER): Payer: Self-pay

## 2023-10-04 ENCOUNTER — Encounter (HOSPITAL_BASED_OUTPATIENT_CLINIC_OR_DEPARTMENT_OTHER): Admitting: Certified Nurse Midwife

## 2023-10-04 ENCOUNTER — Telehealth (HOSPITAL_BASED_OUTPATIENT_CLINIC_OR_DEPARTMENT_OTHER): Payer: Self-pay

## 2023-10-04 NOTE — Telephone Encounter (Signed)
 Left VM for pt to come sign appeal for wegovy . Forms sent to front desk for pt.

## 2023-10-06 ENCOUNTER — Other Ambulatory Visit (HOSPITAL_BASED_OUTPATIENT_CLINIC_OR_DEPARTMENT_OTHER): Payer: Self-pay

## 2023-10-07 ENCOUNTER — Other Ambulatory Visit (HOSPITAL_BASED_OUTPATIENT_CLINIC_OR_DEPARTMENT_OTHER): Payer: Self-pay

## 2023-10-07 NOTE — Telephone Encounter (Signed)
 Patient signed appeal and has been faxed.

## 2023-10-11 ENCOUNTER — Other Ambulatory Visit (HOSPITAL_BASED_OUTPATIENT_CLINIC_OR_DEPARTMENT_OTHER): Payer: Self-pay

## 2023-10-13 ENCOUNTER — Other Ambulatory Visit (HOSPITAL_BASED_OUTPATIENT_CLINIC_OR_DEPARTMENT_OTHER): Payer: Self-pay

## 2023-10-13 ENCOUNTER — Encounter (HOSPITAL_BASED_OUTPATIENT_CLINIC_OR_DEPARTMENT_OTHER): Payer: Self-pay

## 2023-10-14 ENCOUNTER — Other Ambulatory Visit (HOSPITAL_BASED_OUTPATIENT_CLINIC_OR_DEPARTMENT_OTHER): Payer: Self-pay

## 2023-10-15 ENCOUNTER — Other Ambulatory Visit (HOSPITAL_BASED_OUTPATIENT_CLINIC_OR_DEPARTMENT_OTHER): Payer: Self-pay

## 2023-10-18 ENCOUNTER — Other Ambulatory Visit (HOSPITAL_BASED_OUTPATIENT_CLINIC_OR_DEPARTMENT_OTHER): Payer: Self-pay

## 2023-10-19 ENCOUNTER — Other Ambulatory Visit (HOSPITAL_BASED_OUTPATIENT_CLINIC_OR_DEPARTMENT_OTHER): Payer: Self-pay

## 2023-10-20 ENCOUNTER — Telehealth (HOSPITAL_BASED_OUTPATIENT_CLINIC_OR_DEPARTMENT_OTHER): Payer: Self-pay

## 2023-10-20 NOTE — Telephone Encounter (Signed)
 Tried to call pt concerning wegovy  PA. We needed to see if she is doing any exercises or diet plans.

## 2023-10-21 ENCOUNTER — Other Ambulatory Visit (HOSPITAL_BASED_OUTPATIENT_CLINIC_OR_DEPARTMENT_OTHER): Payer: Self-pay

## 2023-10-21 NOTE — Telephone Encounter (Signed)
 Copied from CRM #[id number]. Topic: Clinical - Prescription Issue >> Oct 19, 2023 11:43 AM Charlet HERO wrote: Reason for CRM: [facility] appeals is calling to get the medical records sent with the past attempts for weight loss sent back to her she is faxing the information over again.

## 2023-10-22 ENCOUNTER — Encounter (HOSPITAL_BASED_OUTPATIENT_CLINIC_OR_DEPARTMENT_OTHER): Payer: Self-pay | Admitting: Student

## 2023-10-25 ENCOUNTER — Encounter (HOSPITAL_BASED_OUTPATIENT_CLINIC_OR_DEPARTMENT_OTHER): Payer: Self-pay

## 2023-10-25 ENCOUNTER — Other Ambulatory Visit (HOSPITAL_BASED_OUTPATIENT_CLINIC_OR_DEPARTMENT_OTHER): Payer: Self-pay

## 2023-10-25 ENCOUNTER — Encounter (HOSPITAL_BASED_OUTPATIENT_CLINIC_OR_DEPARTMENT_OTHER): Admitting: Certified Nurse Midwife

## 2023-10-25 NOTE — Progress Notes (Deleted)
 ANNUAL EXAM Patient name: Lauren Cox MRN [id number]  Date of birth: [birth year] Chief Complaint:   No chief complaint on file.  History of Present Illness:   Lauren Cox is a 48 y.o. [phone] Caucasian female being seen today for a routine annual exam.  Current complaints: ***  Patient's last menstrual period was 09/18/2018 (exact date).   The pregnancy intention screening data noted above was reviewed. Potential methods of contraception were discussed. The patient elected to proceed with No data recorded.   Last pap 09/20/2018. Results were: NILM w/ HRHPV negative. H/O abnormal pap: {yes/yes***/no:23866} Last mammogram: 09/20/2018. Results were: normal. Family h/o breast cancer: {yes***/no:23838} Last colonoscopy: ***. Results were: {normal, abnormal, n/a:23837}. Family h/o colorectal cancer: {yes***/no:23838}     09/29/2023    1:39 PM  Depression screen PHQ 2/9  Decreased Interest 3  Down, Depressed, Hopeless 3  PHQ - 2 Score 6  Altered sleeping 3  Tired, decreased energy 3  Change in appetite 3  Feeling bad or failure about yourself  3  Trouble concentrating 3  Moving slowly or fidgety/restless 3  Suicidal thoughts 0  PHQ-9 Score 24  Difficult doing work/chores Extremely dIfficult        09/29/2023    1:40 PM  GAD 7 : Generalized Anxiety Score  Nervous, Anxious, on Edge 3  Control/stop worrying 2  Worry too much - different things 3  Trouble relaxing 3  Restless 3  Easily annoyed or irritable 2  Afraid - awful might happen 2  Total GAD 7 Score 18  Anxiety Difficulty Extremely difficult     Review of Systems:   Pertinent items are noted in HPI Denies any headaches, blurred vision, fatigue, shortness of breath, chest pain, abdominal pain, abnormal vaginal discharge/itching/odor/irritation, problems with periods, bowel movements, urination, or intercourse unless otherwise stated above. Pertinent History Reviewed:  Reviewed past medical,surgical, social and  family history.  Reviewed problem list, medications and allergies. Physical Assessment:  There were no vitals filed for this visit.There is no height or weight on file to calculate BMI.        Physical Examination:   General appearance - well appearing, and in no distress  Mental status - alert, oriented to person, place, and time  Psych:  She has a normal mood and affect  Skin - warm and dry, normal color, no suspicious lesions noted  Chest - effort normal, all lung fields clear to auscultation bilaterally  Heart - normal rate and regular rhythm  Neck:  midline trachea, no thyromegaly or nodules  Breasts - breasts appear normal, no suspicious masses, no skin or nipple changes or  axillary nodes  Abdomen - soft, nontender, nondistended, no masses or organomegaly  Pelvic - VULVA: normal appearing vulva with no masses, tenderness or lesions  VAGINA: normal appearing vagina with normal color and discharge, no lesions  CERVIX: normal appearing cervix without discharge or lesions, no CMT  Thin prep pap is {Desc; done/not:10129} *** HR HPV cotesting  UTERUS: uterus is felt to be normal size, shape, consistency and nontender   ADNEXA: No adnexal masses or tenderness noted.  Rectal - normal rectal, good sphincter tone, no masses felt. Hemoccult: ***  Extremities:  No swelling or varicosities noted  Chaperone present for exam  No results found for this or any previous visit (from the past 24 hours).  Assessment & Plan:  1) Well-Woman Exam  2) ***  Labs/procedures today: ***  Mammogram: {Mammo f/u:25212::@ 48yo}, or sooner if problems Colonoscopy: {TCS  q/l:74786::@ 48yo}, or sooner if problems  No orders of the defined types were placed in this encounter.   Meds: No orders of the defined types were placed in this encounter.   Follow-up: No follow-ups on file.  Braelin Costlow E, RN 10/25/2023 1:07 PM

## 2023-10-27 ENCOUNTER — Ambulatory Visit (HOSPITAL_BASED_OUTPATIENT_CLINIC_OR_DEPARTMENT_OTHER): Admitting: Student

## 2023-10-27 ENCOUNTER — Other Ambulatory Visit (HOSPITAL_BASED_OUTPATIENT_CLINIC_OR_DEPARTMENT_OTHER): Payer: Self-pay

## 2023-10-28 ENCOUNTER — Ambulatory Visit (HOSPITAL_BASED_OUTPATIENT_CLINIC_OR_DEPARTMENT_OTHER): Admitting: Student

## 2023-11-01 ENCOUNTER — Ambulatory Visit (HOSPITAL_COMMUNITY)
Admission: RE | Admit: 2023-11-01 | Discharge: 2023-11-01 | Disposition: A | Discharge status: Home / Self Care | Source: Ambulatory Visit

## 2023-11-01 ENCOUNTER — Other Ambulatory Visit (HOSPITAL_BASED_OUTPATIENT_CLINIC_OR_DEPARTMENT_OTHER): Payer: Self-pay

## 2023-11-01 ENCOUNTER — Ambulatory Visit

## 2023-11-01 VITALS — BP 130/88 | HR 100 | Temp 98.4°F | Ht 62.21 in | Wt 212.6 lb

## 2023-11-01 DIAGNOSIS — F321 Major depressive disorder, single episode, moderate: Secondary | ICD-10-CM

## 2023-11-01 DIAGNOSIS — G4489 Other headache syndrome: Secondary | ICD-10-CM | POA: Diagnosis present

## 2023-11-01 DIAGNOSIS — F1021 Alcohol dependence, in remission: Secondary | ICD-10-CM

## 2023-11-01 DIAGNOSIS — Z114 Encounter for screening for human immunodeficiency virus [HIV]: Secondary | ICD-10-CM

## 2023-11-01 DIAGNOSIS — E782 Mixed hyperlipidemia: Secondary | ICD-10-CM

## 2023-11-01 DIAGNOSIS — I10 Essential (primary) hypertension: Secondary | ICD-10-CM | POA: Diagnosis not present

## 2023-11-01 DIAGNOSIS — H539 Unspecified visual disturbance: Secondary | ICD-10-CM

## 2023-11-01 DIAGNOSIS — Z23 Encounter for immunization: Secondary | ICD-10-CM

## 2023-11-01 DIAGNOSIS — Z1211 Encounter for screening for malignant neoplasm of colon: Secondary | ICD-10-CM

## 2023-11-01 DIAGNOSIS — Z1159 Encounter for screening for other viral diseases: Secondary | ICD-10-CM

## 2023-11-01 DIAGNOSIS — H4710 Unspecified papilledema: Secondary | ICD-10-CM | POA: Diagnosis not present

## 2023-11-01 DIAGNOSIS — Z1231 Encounter for screening mammogram for malignant neoplasm of breast: Secondary | ICD-10-CM

## 2023-11-01 DIAGNOSIS — R58 Hemorrhage, not elsewhere classified: Secondary | ICD-10-CM | POA: Insufficient documentation

## 2023-11-01 MED ORDER — OLMESARTAN MEDOXOMIL 40 MG PO TABS
40.0000 mg | ORAL_TABLET | Freq: Every day | ORAL | 1 refills | Status: AC
  Filled 2023-11-01: qty 90, 90d supply, fill #0

## 2023-11-01 MED ORDER — GADOBUTROL 1 MMOL/ML IV SOLN
10.0000 mL | Freq: Once | INTRAVENOUS | Status: AC | PRN
  Administered 2023-11-01: 10 mL via INTRAVENOUS

## 2023-11-01 NOTE — Assessment & Plan Note (Signed)
 Hypertension with adverse effect of hydrochlorothiazide (HCTZ) Hypertension previously managed with olmesartan and hydrochlorothiazide combination. Suspected adverse reaction to HCTZ (she has sulpha anaphylaxis) causing photosensitive rash and excessive thirst with increased urination. Blood pressure currently well-controlled. - Discontinue olmesartan and hydrochlorothiazide combination - Prescribe olmesartan 40 mg once daily - Send prescription to [facility] - Monitor blood pressure and symptoms

## 2023-11-01 NOTE — Assessment & Plan Note (Signed)
 Major depressive disorder and generalized anxiety. Previously on Lexapro , self-tapered off due to side effects and dosage concerns. Upcoming psychiatric appointment for medication management. - Await psychiatric evaluation on Wednesday for medication management - Consider resuming antidepressant therapy based on psychiatric recommendations

## 2023-11-01 NOTE — Assessment & Plan Note (Signed)
 Previously managed with rosuvastatin 5 mg daily. She has not been taking the medication due to running out and concerns about side effects when dosage was increased. - Order blood work to assess current lipid levels

## 2023-11-01 NOTE — Assessment & Plan Note (Signed)
 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   Symptoms suggest increased intracranial pressure, possibly pseudotumor cerebri. Differential includes normal pressure hydrocephalus or intracranial hypertension. No recent MRI of the brain. - Order MRI of the brain with contrast at [facility] urgently. If normal MRI, could be considered for treatment with a carbonic anhydrase inhibitor. - Place urgent referral to neurology for evaluation within one week.  - Advise to go to the emergency room if symptoms worsen

## 2023-11-01 NOTE — Assessment & Plan Note (Signed)
 In REMISSION SINCE 2012.

## 2023-11-01 NOTE — Assessment & Plan Note (Signed)
 Has chronic cervicogenic headaches. She did have cervical fusion surgery. But continues to have worsening occipital headaches along with visual changes and tinnitus. Diagnosed with papilledema by optometrist on 10/29/23  Ordered MRI to rule out mass. Dd: intracranial htn/cervical degenerative disc disease Will monitor

## 2023-11-01 NOTE — Progress Notes (Signed)
 Subjective:  Patient ID: Lauren Cox, female    DOB: [birth year]  Age: 48 y.o. MRN: [id number]  Chief Complaint  Patient presents with   Establish Care    HPI:  Patient is establishing as a new patient.  Discussed the use of AI scribe software for clinical note transcription with the patient, who gave verbal consent to proceed.  History of Present Illness   Lauren Cox is a 48 year old female with hypertension and hyperlipidemia who presents with worsening vision changes and headaches.  Visual disturbances - Progressive vision deterioration over the past 6 months - Episodes of vision blackout with positional changes - Presence of 'floaties' and dimming of lights - Bilateral papilledema diagnosed on October 3rd, 2025, during dilated eye exam  Headache and neurological symptoms - Severe headaches, particularly upon waking - Headaches described as pressure, located in the occipital region - Pulsatile tinnitus present for the past year to year and a half - Ringing in the ears worsens when lying flat, requiring sleeping at an angle  Neck pain - Severe neck pain ongoing for the past year to year and a half - History of cervical fusion in 2014 with initial improvement in neck pain  Hypertension and antihypertensive medication side effects - History of hypertension, previously well controlled on olmesartan - Olmesartan/hydrochlorothiazide (HCTZ) combination added in May 2025 - Development of recurrent rashes and increased photosensitivity since starting HCTZ - Excessive thirst and frequent urination (every 45 minutes to an hour) - Continues olmesartan/HCTZ due to blood pressure concerns but desires to discontinue HCTZ due to side effects  Hyperlipidemia and statin intolerance - History of hyperlipidemia - Previously prescribed rosuvastatin 5 mg daily, increased to 10 mg - Not currently taking rosuvastatin due to running out of 5 mg dose and adverse reactions when  Lexapro  was increased - No current statin therapy  Psychiatric history and medication changes - History of major depressive disorder and generalized anxiety disorder - Previously on Lexapro , titrated off after adverse reactions at higher dose (40 mg) - Currently managing anxiety with clonazepam as needed  Sleep disturbances and sleep apnea - History of sleep apnea - Not currently using CPAP machine  Gynecologic and menstrual history - History of anemia due to heavy menstrual bleeding, resolved after hormone replacement therapy - No menstrual periods for the past two years  Substance use history - History of alcohol use, in remission since 2012 - History of smoking since 2021, currently less than half a pack per day  Weight gain and metabolic changes - Significant weight gain following bereavement of her daughter several years ago due to leukemia,  associated with onset of hypertension and hyperlipidemia  Past surgical history - Cervical fusion in 2014 - Cholecystectomy in 2006 - Cervical biopsy (date unspecified)  Other medical history - History of shingles in 2017             11/01/2023    8:59 AM 09/29/2023    1:39 PM  Depression screen PHQ 2/9  Decreased Interest 3 3  Down, Depressed, Hopeless 3 3  PHQ - 2 Score 6 6  Altered sleeping 3 3  Tired, decreased energy 3 3  Change in appetite 0 3  Feeling bad or failure about yourself  3 3  Trouble concentrating 3 3  Moving slowly or fidgety/restless 3 3  Suicidal thoughts 0 0  PHQ-9 Score 21 24  Difficult doing work/chores Extremely dIfficult Extremely dIfficult         05/30/2018  7:45 PM 05/15/2019   11:06 PM 04/26/2021    2:51 AM 11/01/2023    8:58 AM  Fall Risk  Falls in the past year?    0  Was there an injury with Fall?    0  Fall Risk Category Calculator    0  (RETIRED) Patient Fall Risk Level Low fall risk  Low fall risk  Low fall risk    Patient at Risk for Falls Due to    No Fall Risks  Fall risk  Follow up    Falls evaluation completed     Data saved with a previous flowsheet row definition     Current Outpatient Medications on File Prior to Visit  Medication Sig Dispense Refill   EPINEPHrine  0.3 mg/0.3 mL IJ SOAJ injection Inject 0.3 mg into the muscle as needed for anaphylaxis. 2 each 3   rosuvastatin (CRESTOR) 5 MG tablet Take 5 mg by mouth daily.     triamcinolone  cream (KENALOG ) 0.1 % Apply 1 Application topically 2 (two) times daily for up to 7 days on and then take 7 days off. 80 g 0   clonazePAM (KLONOPIN) 0.5 MG tablet Take 0.5 mg by mouth as needed for anxiety. (Patient not taking: Reported on 11/01/2023)     No current facility-administered medications on file prior to visit.  . Social History   Socioeconomic History   Marital status: Single    Spouse name: Not on file   Number of children: 2   Years of education: Not on file   Highest education level: Bachelor's degree (e.g., BA, AB, BS)  Occupational History   Not on file  Tobacco Use   Smoking status: Some Days    Current packs/day: 0.50    Average packs/day: 0.5 packs/day for 3.8 years (1.9 ttl pk-yrs)    Types: Cigarettes    Start date: 2022    Passive exposure: Past   Smokeless tobacco: Never   Tobacco comments:    Actively trying to quit  Vaping Use   Vaping status: Never Used  Substance and Sexual Activity   Alcohol use: Not Currently    Comment: occ   Drug use: Yes    Types: Benzodiazepines, Marijuana    Comment: benzos are prescribed , THC gummies occasionally for anxiety   Sexual activity: Not Currently    Partners: Male    Birth control/protection: I.U.D.    Comment: No menses since 2022  Other Topics Concern   Not on file  Social History Narrative   Not on file   Social Drivers of Health   Financial Resource Strain: Low Risk  (09/23/2023)   Overall Financial Resource Strain (CARDIA)    Difficulty of Paying Living Expenses: Not very hard  Food Insecurity: No Food Insecurity  (09/23/2023)   Hunger Vital Sign    Worried About Running Out of Food in the Last Year: Never true    Ran Out of Food in the Last Year: Never true  Transportation Needs: No Transportation Needs (09/23/2023)   PRAPARE - Administrator, Civil Service (Medical): No    Lack of Transportation (Non-Medical): No  Physical Activity: Insufficiently Active (09/23/2023)   Exercise Vital Sign    Days of Exercise per Week: 5 days    Minutes of Exercise per Session: 20 min  Stress: Stress Concern Present (09/23/2023)   [employer] of Occupational Health - Occupational Stress Questionnaire    Feeling of Stress: Very much  Social Connections: Socially Isolated (09/23/2023)  Social Advertising account executive    Frequency of Communication with Friends and Family: Never    Frequency of Social Gatherings with Friends and Family: Never    Attends Religious Services: Never    Database administrator or Organizations: No    Attends Engineer, structural: Not on file    Marital Status: Living with partner   Past Medical History:  Diagnosis Date   Allergy    Anemia    Past due to excessive menstrual bleeding   Anxiety    Clotting disorder    Gyn related since puberty   Herpes zoster 04/11/2015   Hx of polycystic ovarian syndrome 08/17/2023   This was entered as ovarian cancer.  Patient has never had ovarian cancer but did have PCOS.     Hyperlipidemia    Hypertension    Major depressive disorder    Sleep apnea    Family History  Problem Relation Age of Onset   Drug abuse Mother    Breast cancer Mother        NEG FOR BRCA GENE   Hyperlipidemia Mother    Drug abuse Father    ADD / ADHD Brother    ADD / ADHD Brother    Healthy Brother    Healthy Brother    Leukemia Daughter    Asthma Son    ADD / ADHD Son    Hyperlipidemia Maternal Aunt     Review of Systems  Constitutional:  Negative for chills, fatigue and fever.  HENT:  Positive for tinnitus. Negative for  congestion, ear pain, sinus pressure and sore throat.   Eyes:  Positive for photophobia and visual disturbance.  Respiratory:  Negative for cough and shortness of breath.   Cardiovascular:  Negative for chest pain.  Gastrointestinal:  Negative for abdominal pain, constipation, diarrhea, nausea and vomiting.  Genitourinary:  Negative for dysuria and frequency.  Musculoskeletal:  Positive for neck pain. Negative for arthralgias, back pain and myalgias.  Neurological:  Positive for headaches. Negative for dizziness.  Psychiatric/Behavioral:  Positive for dysphoric mood. The patient is not nervous/anxious.      Objective:  BP 130/88   Pulse 100   Temp 98.4 F (36.9 C)   Ht 5' 2.21 (1.58 m)   Wt 212 lb 9.6 oz (96.4 kg)   LMP 09/18/2018 (Exact Date)   SpO2 98%   BMI 38.62 kg/m      11/01/2023    8:54 AM 09/29/2023    1:28 PM 03/16/2023    3:49 PM  BP/Weight  Systolic BP 130 130 155  Diastolic BP 88 75 68  Wt. (Lbs) 212.6 211.1   BMI 38.62 kg/m2 38.36 kg/m2     Physical Exam Vitals and nursing note reviewed.  Constitutional:      Appearance: She is obese. She is not ill-appearing or diaphoretic.  HENT:     Head: Normocephalic and atraumatic.     Nose: Nose normal.     Mouth/Throat:     Mouth: Mucous membranes are moist.  Eyes:     Extraocular Movements: Extraocular movements intact.     Pupils: Pupils are equal, round, and reactive to light.  Cardiovascular:     Rate and Rhythm: Normal rate and regular rhythm.  Pulmonary:     Effort: Pulmonary effort is normal.     Breath sounds: Normal breath sounds.  Musculoskeletal:        General: Normal range of motion.  Skin:    General: Skin is  warm.  Neurological:     General: No focal deficit present.     Mental Status: She is alert and oriented to person, place, and time. Mental status is at baseline.     Cranial Nerves: No cranial nerve deficit.     Sensory: No sensory deficit.     Motor: No weakness.     Coordination:  Coordination normal.  Psychiatric:        Mood and Affect: Mood normal.        Behavior: Behavior normal.        Lab Results  Component Value Date   WBC 8.0 04/26/2021   HGB 14.3 04/26/2021   HCT 40.4 04/26/2021   PLT 328 04/26/2021   GLUCOSE 126 (H) 04/26/2021   ALT 17 04/26/2021   AST 19 04/26/2021   NA 137 04/26/2021   K 3.2 (L) 04/26/2021   CL 105 04/26/2021   CREATININE 0.76 04/26/2021   BUN 8 04/26/2021   CO2 22 04/26/2021   TSH 5.510 (H) 05/25/2019      Assessment & Plan:  Papilledema of both eyes Assessment & Plan: Papilledema with vision changes and headaches Bilateral papilledema diagnosed on October 3rd by an optometrist, associated with severe headaches ( chronic but worsening), pulsatile tinnitus, vision changes including blackouts, double vision, and floaters.   Symptoms suggest increased intracranial pressure, possibly pseudotumor cerebri. Differential includes normal pressure hydrocephalus or intracranial hypertension. No recent MRI of the brain. - Order MRI of the brain with contrast at [facility] urgently. If normal MRI, could be considered for treatment with a carbonic anhydrase inhibitor. - Place urgent referral to neurology for evaluation within one week.  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   Moderate major depression (HCC) Assessment & Plan: Major depressive disorder and generalized anxiety. Previously on Lexapro , self-tapered off due to side effects and dosage concerns. Upcoming psychiatric appointment for medication management. - Await psychiatric evaluation on Wednesday for medication management - Consider resuming antidepressant therapy based on psychiatric recommendations   Primary hypertension Assessment & Plan: Hypertension with adverse effect of hydrochlorothiazide  (HCTZ) Hypertension previously managed with olmesartan and hydrochlorothiazide combination. Suspected adverse reaction to HCTZ (she has sulpha anaphylaxis) causing photosensitive rash and excessive thirst with increased urination. Blood pressure currently well-controlled. - Discontinue olmesartan and hydrochlorothiazide combination - Prescribe olmesartan 40 mg once daily - Send prescription to [facility] - Monitor blood pressure and symptoms  Orders: -     CBC with Differential/Platelet -     Comprehensive metabolic panel with GFR  Mixed hyperlipidemia Assessment & Plan: Previously managed with rosuvastatin 5 mg daily. She has not been taking the medication due to running out and concerns about side effects when dosage was increased. - Order blood work to assess current lipid levels  Orders: -     CBC with Differential/Platelet -     Lipid panel  Encounter for hepatitis C screening test for low risk patient -     Hepatitis C antibody  Screening for HIV without presence of risk factors -     HIV Antibody (routine testing w rflx)  Screen for colon cancer -     Ambulatory referral to Gastroenterology  Encounter for screening mammogram for malignant neoplasm of breast -     Digital Screening Mammogram, Left and Right; Future  Vision  changes -     Ambulatory referral to Neurology  Other headache syndrome Assessment & Plan: Has chronic cervicogenic headaches. She did have cervical fusion surgery. But continues to have worsening occipital headaches along with visual changes and tinnitus. Diagnosed with papilledema by optometrist on 10/29/23  Ordered MRI to rule out mass. Dd: intracranial htn/cervical degenerative disc disease Will monitor  Orders: -     MR BRAIN W WO CONTRAST; Future -     Ambulatory referral to Neurology  Encounter for immunization -     Flu vaccine trivalent PF, 6mos and older(Flulaval,Afluria,Fluarix,Fluzone) -     Pfizer Comirnaty Covid-19  Vaccine 60yrs & older  Other orders -     Olmesartan Medoxomil; Take 1 tablet (40 mg total) by mouth daily.  Dispense: 90 tablet; Refill: 1       Postmenopausal state No menstrual period for two years, likely postmenopausal. Symptoms include mood changes and neck pain, possibly related to menopause. - Monitor symptoms  Tobacco use, current Current smoker, started in 2021, reduced to less than half a pack per day. Actively attempting to quit. - Encourage smoking cessation  General Health Maintenance Last Pap smear in 2021. - Schedule Pap smear at next visit       Meds ordered this encounter  Medications   olmesartan (BENICAR) 40 MG tablet    Sig: Take 1 tablet (40 mg total) by mouth daily.    Dispense:  90 tablet    Refill:  1    Discontinued the combination pill. Please dispense olmesartan ONLY   Orders Placed This Encounter  Procedures   MM DIGITAL SCREENING BILATERAL   MR Brain W Wo Contrast   Flu vaccine trivalent PF, 6mos and older(Flulaval,Afluria,Fluarix,Fluzone)   Pfizer Comirnaty Covid-19 Vaccine 87yrs & older   CBC with Differential   Comprehensive metabolic panel with GFR   Lipid Panel   Hepatitis C antibody   HIV Antibody (routine testing w rflx)   Ambulatory referral to Gastroenterology   Ambulatory referral to Neurology         .   Follow-up: Return in about 2 weeks (around 11/15/2023) for chronic disease follow up.  AVS was given to patient prior to departure.  [facility] [phone]

## 2023-11-02 ENCOUNTER — Ambulatory Visit: Payer: Self-pay

## 2023-11-02 LAB — CBC WITH DIFFERENTIAL/PLATELET
Basophils Absolute: 0.1 x10E3/uL (ref 0.0–0.2)
Basos: 1 %
EOS (ABSOLUTE): 0.4 x10E3/uL (ref 0.0–0.4)
Eos: 5 %
Hematocrit: 43.7 % (ref 34.0–46.6)
Hemoglobin: 14.6 g/dL (ref 11.1–15.9)
Immature Grans (Abs): 0 x10E3/uL (ref 0.0–0.1)
Immature Granulocytes: 0 %
Lymphocytes Absolute: 2.2 x10E3/uL (ref 0.7–3.1)
Lymphs: 29 %
MCH: 32.2 pg (ref 26.6–33.0)
MCHC: 33.4 g/dL (ref 31.5–35.7)
MCV: 97 fL (ref 79–97)
Monocytes Absolute: 0.6 x10E3/uL (ref 0.1–0.9)
Monocytes: 8 %
Neutrophils Absolute: 4.3 x10E3/uL (ref 1.4–7.0)
Neutrophils: 57 %
Platelets: 382 x10E3/uL (ref 150–450)
RBC: 4.53 x10E6/uL (ref 3.77–5.28)
RDW: 11.8 % (ref 11.7–15.4)
WBC: 7.6 x10E3/uL (ref 3.4–10.8)

## 2023-11-02 LAB — COMPREHENSIVE METABOLIC PANEL WITH GFR
ALT: 18 IU/L (ref 0–32)
AST: 20 IU/L (ref 0–40)
Albumin: 4.7 g/dL (ref 3.9–4.9)
Alkaline Phosphatase: 85 IU/L (ref 41–116)
BUN/Creatinine Ratio: 17 (ref 9–23)
BUN: 13 mg/dL (ref 6–24)
Bilirubin Total: 0.2 mg/dL (ref 0.0–1.2)
CO2: 25 mmol/L (ref 20–29)
Calcium: 10.1 mg/dL (ref 8.7–10.2)
Chloride: 96 mmol/L (ref 96–106)
Creatinine, Ser: 0.77 mg/dL (ref 0.57–1.00)
Globulin, Total: 2.7 g/dL (ref 1.5–4.5)
Glucose: 92 mg/dL (ref 70–99)
Potassium: 3.9 mmol/L (ref 3.5–5.2)
Sodium: 138 mmol/L (ref 134–144)
Total Protein: 7.4 g/dL (ref 6.0–8.5)
eGFR: 96 mL/min/1.73 (ref >59)

## 2023-11-02 LAB — HIV ANTIBODY (ROUTINE TESTING W REFLEX): HIV Screen 4th Generation wRfx: NONREACTIVE

## 2023-11-02 LAB — LIPID PANEL
Chol/HDL Ratio: 6 ratio — ABNORMAL HIGH (ref 0.0–4.4)
Cholesterol, Total: 263 mg/dL — ABNORMAL HIGH (ref 100–199)
HDL: 44 mg/dL (ref >39)
LDL Chol Calc (NIH): 195 mg/dL — ABNORMAL HIGH (ref 0–99)
Triglycerides: 131 mg/dL (ref 0–149)
VLDL Cholesterol Cal: 24 mg/dL (ref 5–40)

## 2023-11-02 LAB — HEPATITIS C ANTIBODY: Hep C Virus Ab: NONREACTIVE

## 2023-11-04 ENCOUNTER — Inpatient Hospital Stay (HOSPITAL_BASED_OUTPATIENT_CLINIC_OR_DEPARTMENT_OTHER): Admission: RE | Admit: 2023-11-04 | Source: Ambulatory Visit | Admitting: Radiology

## 2023-11-04 ENCOUNTER — Other Ambulatory Visit (HOSPITAL_BASED_OUTPATIENT_CLINIC_OR_DEPARTMENT_OTHER): Payer: Self-pay

## 2023-11-04 ENCOUNTER — Ambulatory Visit (INDEPENDENT_AMBULATORY_CARE_PROVIDER_SITE_OTHER): Admitting: Neurology

## 2023-11-04 ENCOUNTER — Encounter: Payer: Self-pay | Admitting: Neurology

## 2023-11-04 VITALS — BP 137/87 | HR 88 | Ht 62.0 in | Wt 214.0 lb

## 2023-11-04 DIAGNOSIS — G932 Benign intracranial hypertension: Secondary | ICD-10-CM | POA: Diagnosis not present

## 2023-11-04 MED ORDER — TOPIRAMATE 100 MG PO TABS
100.0000 mg | ORAL_TABLET | Freq: Every evening | ORAL | 6 refills | Status: AC
  Filled 2023-11-04: qty 30, 30d supply, fill #0
  Filled 2024-01-05: qty 90, 90d supply, fill #0
  Filled 2024-01-18: qty 30, 30d supply, fill #0

## 2023-11-04 MED ORDER — TRAZODONE HCL 50 MG PO TABS
50.0000 mg | ORAL_TABLET | Freq: Every evening | ORAL | 1 refills | Status: AC | PRN
  Filled 2023-11-04 – 2023-11-16 (×2): qty 30, 30d supply, fill #0

## 2023-11-04 MED ORDER — ESCITALOPRAM OXALATE 10 MG PO TABS
10.0000 mg | ORAL_TABLET | Freq: Every day | ORAL | 1 refills | Status: AC
  Filled 2023-11-04 – 2023-11-16 (×2): qty 30, 30d supply, fill #0
  Filled 2024-01-05: qty 30, 30d supply, fill #1

## 2023-11-04 MED ORDER — TOPIRAMATE 50 MG PO TABS
ORAL_TABLET | ORAL | 0 refills | Status: DC
  Filled 2023-11-04 – 2023-11-16 (×2): qty 21, 28d supply, fill #0

## 2023-11-04 NOTE — Patient Instructions (Signed)
 Lumbar puncture for opening pressure  Start Topiramate 25 mg nightly for 2 weeks then gradually increase every 2 weeks for a goal of 100 mg nightly.  We might further increase topiramate if needed  Patient has an allergy to sulfa (No Diamox), but we will continue to monitor her while on Topiramate Continue your other medications Continue follow-up PCP Return in 6 months or sooner if worse

## 2023-11-04 NOTE — Progress Notes (Signed)
 [facility]  PATIENT: Lauren Cox DOB: 1975-06-20  REQUESTING CLINICIAN: Sirivol, Mamatha, MD HISTORY FROM: Patient  REASON FOR VISIT: Headaches, Papilledema    HISTORICAL  CHIEF COMPLAINT:  Chief Complaint  Patient presents with   New Patient (Initial Visit)    Pt in room 12. Alone. internal urgent referral for papilledema, vision disturbance, headaches, possible IIH.    HISTORY OF PRESENT ILLNESS:  Discussed the use of AI scribe software for clinical note transcription with the patient, who gave verbal consent to proceed.  Lauren Cox is a 48 year old female with history of chronic headaches, hypertension, hyperlipidemia, anxiety who presents with vision obscurities and headaches. She was referred by an eye doctor for evaluation of papilledema.  She has been experiencing vision obscurities, particularly when changing positions such as lying down and getting up, for the last few years. Episodes of dimming vision sometimes last 30 to 45 seconds, and she has noticed a deterioration in her vision over the past year. She reports that her eye doctor told her she has papilledema and needed an urgent neurology consultation  She has a long history of headaches, which began during puberty and were associated with her menstrual cycle. These headaches improved in her thirties and early forties but worsened following a period of depression after the loss of a child seven years ago. During this time, she experienced severe headaches and weight gain. Her menstrual cycle stopped, and she believes she has gone through menopause, after which her consistent monthly headaches improved. However, she now experiences tension type headaches that she attributes to a C6-7 fusion and discectomy, and has noted new symptoms of projectile vomiting with headaches.  Her current headaches are diffuse, with a sensation of a tight band around her head, particularly in the back,  accompanied by neck stiffness. The headaches are worse in the morning and improve slightly after a couple of hours. They are exacerbated by exertion and positional changes, such as bending over. She also experiences severe ringing in her ears, which is most intense upon waking and diminishes slightly throughout the day.  Her sleep is poor, averaging four hours per night, and she often feels the need to nap. She attributes some of her sleep issues to her history of working night shifts for 25 years, which she stopped in February. She consumes caffeine in the morning but tries to limit her intake.  She has been managing her headaches with ibuprofen and occasionally Midol, which she finds effective due to its caffeine content. She has not been taking any other medications for her headaches.    Headache History and Characteristics: Onset: All her life  Location: Posterior, band like  Quality: squeezing worse in the morning   Intensity: /10.  Duration: all day but worse in the morning  Migrainous Features: Photophobia, phonophobia, nausea, vomiting.  Aura: No  History of brain injury or tumor: No  Family history: Motion sickness: no Cardiac history: no  OTC: Ibuprofen  Caffeine: yes in the morning  Sleep: Terrible, if lucky will get 4 hours  Mood/ Stress: high, and has therapy   Prior prophylaxis: Propranolol: No  Verapamil:No TCA: No Topamax: No Depakote: No Effexor: No Cymbalta: No Neurontin:No  Prior abortives: Triptan: No Anti-emetic: No Steroids: No Ergotamine suppository: No    OTHER MEDICAL CONDITIONS: Chronic headaches, Hypertension, Hyperlipidemia, anxiety, Obesity    REVIEW OF SYSTEMS: Full 14 system review of systems performed and negative with exception of: As noted in the HPI  ALLERGIES: Allergies  Allergen Reactions   Penicillins Rash and Dermatitis    Other reaction(s): ANAPHYLAXIS   Sulfa Antibiotics Anaphylaxis   Fish Allergy Other (See Comments)     Other reaction(s): SWELLING    Fish (substance)   Fish Protein-Containing Drug Products     WHITE FRESH WATER FISH Other reaction(s): SWELLING    HOME MEDICATIONS: Outpatient Medications Prior to Visit  Medication Sig Dispense Refill   clonazePAM (KLONOPIN) 0.5 MG tablet Take 0.5 mg by mouth as needed for anxiety.     EPINEPHrine  0.3 mg/0.3 mL IJ SOAJ injection Inject 0.3 mg into the muscle as needed for anaphylaxis. 2 each 3   olmesartan (BENICAR) 40 MG tablet Take 1 tablet (40 mg total) by mouth daily. 90 tablet 1   rosuvastatin (CRESTOR) 10 MG tablet Take 10 mg by mouth daily.     triamcinolone  cream (KENALOG ) 0.1 % Apply 1 Application topically 2 (two) times daily for up to 7 days on and then take 7 days off. 80 g 0   rosuvastatin (CRESTOR) 5 MG tablet Take 5 mg by mouth daily.     No facility-administered medications prior to visit.    PAST MEDICAL HISTORY: Past Medical History:  Diagnosis Date   Allergy    Anemia    Past due to excessive menstrual bleeding   Anxiety    Clotting disorder    Gyn related since puberty   Herpes zoster 04/11/2015   Hx of polycystic ovarian syndrome 08/17/2023   This was entered as ovarian cancer.  Patient has never had ovarian cancer but did have PCOS.     Hyperlipidemia    Hypertension    Major depressive disorder    Sleep apnea     PAST SURGICAL HISTORY: Past Surgical History:  Procedure Laterality Date   CERVICAL CONE BIOPSY  2016   CHOLECYSTECTOMY  2006   SPINAL FUSION      FAMILY HISTORY: Family History  Problem Relation Age of Onset   Drug abuse Mother    Breast cancer Mother        NEG FOR BRCA GENE   Hyperlipidemia Mother    Drug abuse Father    ADD / ADHD Brother    ADD / ADHD Brother    Healthy Brother    Healthy Brother    Leukemia Daughter    Asthma Son    ADD / ADHD Son    Hyperlipidemia Maternal Aunt     SOCIAL HISTORY: Social History   Socioeconomic History   Marital status: Single    Spouse  name: Not on file   Number of children: 2   Years of education: Not on file   Highest education level: Bachelor's degree (e.g., BA, AB, BS)  Occupational History   Not on file  Tobacco Use   Smoking status: Some Days    Current packs/day: 0.50    Average packs/day: 0.5 packs/day for 3.8 years (1.9 ttl pk-yrs)    Types: Cigarettes    Start date: 2022    Passive exposure: Past   Smokeless tobacco: Never   Tobacco comments:    Actively trying to quit  Vaping Use   Vaping status: Never Used  Substance and Sexual Activity   Alcohol use: Not Currently    Comment: occ   Drug use: Yes    Types: Benzodiazepines, Marijuana    Comment: benzos are prescribed , THC gummies occasionally for anxiety   Sexual activity: Not Currently    Partners: Male  Birth control/protection: I.U.D.    Comment: No menses since 2022  Other Topics Concern   Not on file  Social History Narrative   Not on file   Social Drivers of Health   Financial Resource Strain: Low Risk  (09/23/2023)   Overall Financial Resource Strain (CARDIA)    Difficulty of Paying Living Expenses: Not very hard  Food Insecurity: No Food Insecurity (09/23/2023)   Hunger Vital Sign    Worried About Running Out of Food in the Last Year: Never true    Ran Out of Food in the Last Year: Never true  Transportation Needs: No Transportation Needs (09/23/2023)   PRAPARE - Administrator, Civil Service (Medical): No    Lack of Transportation (Non-Medical): No  Physical Activity: Insufficiently Active (09/23/2023)   Exercise Vital Sign    Days of Exercise per Week: 5 days    Minutes of Exercise per Session: 20 min  Stress: Stress Concern Present (09/23/2023)   [employer] of Occupational Health - Occupational Stress Questionnaire    Feeling of Stress: Very much  Social Connections: Socially Isolated (09/23/2023)   Social Connection and Isolation Panel    Frequency of Communication with Friends and Family: Never     Frequency of Social Gatherings with Friends and Family: Never    Attends Religious Services: Never    Database administrator or Organizations: No    Attends Engineer, structural: Not on file    Marital Status: Living with partner  Intimate Partner Violence: Not At Risk (09/29/2023)   Humiliation, Afraid, Rape, and Kick questionnaire    Fear of Current or Ex-Partner: No    Emotionally Abused: No    Physically Abused: No    Sexually Abused: No    PHYSICAL EXAM  GENERAL EXAM/CONSTITUTIONAL: Vitals:  Vitals:   11/04/23 1245  BP: 137/87  Pulse: 88  Weight: 214 lb (97.1 kg)  Height: 5' 2 (1.575 m)   Body mass index is 39.14 kg/m. Wt Readings from Last 3 Encounters:  11/04/23 214 lb (97.1 kg)  11/01/23 212 lb 9.6 oz (96.4 kg)  09/29/23 211 lb 1.6 oz (95.8 kg)   Patient is in no distress; well developed, nourished and groomed; neck is supple  MUSCULOSKELETAL: Gait, strength, tone, movements noted in Neurologic exam below  NEUROLOGIC: MENTAL STATUS:      No data to display         awake, alert, oriented to person, place and time recent and remote memory intact normal attention and concentration language fluent, comprehension intact, naming intact fund of knowledge appropriate  CRANIAL NERVE:  2nd, 3rd, 4th, 6th - pupils equal and reactive to light, visual fields full to confrontation, extraocular muscles intact, no nystagmus 5th - facial sensation symmetric 7th - facial strength symmetric 8th - hearing intact 9th - palate elevates symmetrically, uvula midline 11th - shoulder shrug symmetric 12th - tongue protrusion midline  MOTOR:  normal bulk and tone, full strength in the BUE, BLE  SENSORY:  normal and symmetric to light touch  COORDINATION:  finger-nose-finger, fine finger movements normal  GAIT/STATION:  normal    DIAGNOSTIC DATA (LABS, IMAGING, TESTING) - I reviewed patient records, labs, notes, testing and imaging myself where  available.  Lab Results  Component Value Date   WBC 7.6 11/01/2023   HGB 14.6 11/01/2023   HCT 43.7 11/01/2023   MCV 97 11/01/2023   PLT 382 11/01/2023      Component Value Date/Time   NA  138 11/01/2023 0956   NA 138 09/29/2013 1116   K 3.9 11/01/2023 0956   K 3.4 (L) 09/29/2013 1116   CL 96 11/01/2023 0956   CL 108 (H) 09/29/2013 1116   CO2 25 11/01/2023 0956   CO2 21 09/29/2013 1116   GLUCOSE 92 11/01/2023 0956   GLUCOSE 126 (H) 04/26/2021 0259   GLUCOSE 77 09/29/2013 1116   BUN 13 11/01/2023 0956   BUN 4 (L) 09/29/2013 1116   CREATININE 0.77 11/01/2023 0956   CREATININE 0.40 (L) 09/29/2013 1116   CALCIUM 10.1 11/01/2023 0956   CALCIUM 8.7 09/29/2013 1116   PROT 7.4 11/01/2023 0956   ALBUMIN 4.7 11/01/2023 0956   AST 20 11/01/2023 0956   ALT 18 11/01/2023 0956   ALKPHOS 85 11/01/2023 0956   BILITOT 0.2 11/01/2023 0956   GFRNONAA >60 04/26/2021 0259   GFRNONAA >60 09/29/2013 1116   GFRAA >60 05/15/2019 2314   GFRAA >60 09/29/2013 1116   Lab Results  Component Value Date   CHOL 263 (H) 11/01/2023   HDL 44 11/01/2023   LDLCALC 195 (H) 11/01/2023   TRIG 131 11/01/2023   CHOLHDL 6.0 (H) 11/01/2023   No results found for: HGBA1C No results found for: VITAMINB12 Lab Results  Component Value Date   TSH 5.510 (H) 05/25/2019    MRI Brain 11/01/2023 1. No evidence of an acute intracranial abnormality. 2. Few small chronic insults within the cerebral white matter, nonspecific but most often secondary to chronic small vessel ischemia. 3. Very small right frontal lobe developmental venous anomaly (anatomic variant). 4. Otherwise unremarkable MRI brain. 5. Unremarkable MRI appearance of the orbits.    ASSESSMENT AND PLAN  48 y.o. 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 MRI did not show changes typically associated with long-standing IIH, such as optic globe flattening or tortuous optic nerve. A lumbar puncture is required to confirm the diagnosis by measuring intracranial pressure. Chronic diffuse headaches, worse in the morning and with exertion, accompanied by tinnitus. The headaches are positional and associated with visual changes. The symptoms are consistent with IIH. Treatment options include acetazolamide and topiramate. Due to a severe sulfa allergy, acetazolamide is contraindicated. Topiramate is considered safe, with potential side effects including brain fog, but it may aid in weight loss. 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 This contraindicates the use of acetazolamide for IIH treatment. - Avoid acetazolamide due to sulfa allergy  Obesity Weight gain may contribute to the symptoms of IIH. She has recently lost 35 pounds and is working on further [employer].     1. Idiopathic intracranial hypertension     Patient Instructions  Lumbar puncture for opening pressure  Start Topiramate 25 mg nightly for 2 weeks then gradually increase every 2 weeks for a goal of 100 mg nightly.  We might further increase topiramate if needed  Patient has an allergy to sulfa (No Diamox), but we will continue to monitor her while on Topiramate Continue your other medications Continue follow-up PCP Return in 6 months or sooner if worse   Orders Placed This  Encounter  Procedures   DG FL GUIDED LUMBAR PUNCTURE    Meds ordered this encounter  Medications   topiramate (TOPAMAX) 50 MG tablet    Sig: Take 0.5 tablets (25 mg total) by mouth at  bedtime for 14 days, THEN 1 tablet (50 mg total) at bedtime for 14 days.    Dispense:  21 tablet    Refill:  0   topiramate (TOPAMAX) 100 MG tablet    Sig: Take 1 tablet (100 mg total) by mouth at bedtime.    Dispense:  30 tablet    Refill:  6    Please dispense on or after November 3    Return in about 6 months (around 05/04/2024).    Pastor Falling, MD 11/04/2023, 1:39 PM  [facility] [address], [address] [place], [place] [address] [phone]

## 2023-11-15 ENCOUNTER — Other Ambulatory Visit (HOSPITAL_BASED_OUTPATIENT_CLINIC_OR_DEPARTMENT_OTHER): Payer: Self-pay

## 2023-11-16 ENCOUNTER — Other Ambulatory Visit (HOSPITAL_BASED_OUTPATIENT_CLINIC_OR_DEPARTMENT_OTHER): Payer: Self-pay

## 2023-11-16 ENCOUNTER — Ambulatory Visit

## 2023-11-16 NOTE — Discharge Instructions (Signed)
 Lumbar Puncture Discharge Instructions  You may resume normal activities; however, do not exert yourself strongly or do any heavy lifting today and tomorrow.   DO NOT drive today.    You may resume your normal diet and medications unless otherwise indicated. Drink a lot of extra fluids today and tomorrow.   The incidence of a spinal headache (headache, nausea and/or vomiting is about 5% (one in 20 patients).  If you develop a headache when you are sitting up or standing that gets better when you lie down, please lie flat for 24 hours and drink plenty of fluids until the headache goes away.  Caffeinated beverages may be helpful.   If you develop severe nausea and vomiting or a headache that does not go away with the flat bedrest after 48 hours, please call [phone].   Call your physician for a follow-up appointment.  The results of your myelogram will be sent directly to your physician by the following day.  Please call us  at [phone] if you have any questions or if complications develop after you arrive home.     Thank you for visiting [facility] today!

## 2023-11-17 ENCOUNTER — Ambulatory Visit

## 2023-11-17 ENCOUNTER — Ambulatory Visit
Admission: RE | Admit: 2023-11-17 | Discharge: 2023-11-17 | Disposition: A | Discharge status: Home / Self Care | Source: Ambulatory Visit | Attending: Neurology | Admitting: Neurology

## 2023-11-17 VITALS — BP 108/72 | HR 69

## 2023-11-17 DIAGNOSIS — H4710 Unspecified papilledema: Secondary | ICD-10-CM

## 2023-11-17 DIAGNOSIS — H539 Unspecified visual disturbance: Secondary | ICD-10-CM

## 2023-11-17 DIAGNOSIS — G932 Benign intracranial hypertension: Secondary | ICD-10-CM

## 2023-11-17 DIAGNOSIS — G44001 Cluster headache syndrome, unspecified, intractable: Secondary | ICD-10-CM

## 2023-11-17 LAB — CSF CELL COUNT WITH DIFFERENTIAL: RBC Count, CSF: 13 {cells}/uL — ABNORMAL HIGH

## 2023-11-17 LAB — GLUCOSE, CSF: Glucose, CSF: 62 mg/dL (ref 40–80)

## 2023-11-17 LAB — PROTEIN, CSF: Total Protein, CSF: 22 mg/dL (ref 15–45)

## 2023-11-18 ENCOUNTER — Ambulatory Visit: Payer: Self-pay | Admitting: Neurology

## 2023-11-18 ENCOUNTER — Telehealth: Payer: Self-pay

## 2023-11-22 ENCOUNTER — Ambulatory Visit

## 2023-11-25 ENCOUNTER — Ambulatory Visit

## 2023-11-29 ENCOUNTER — Ambulatory Visit

## 2023-12-02 ENCOUNTER — Encounter (HOSPITAL_BASED_OUTPATIENT_CLINIC_OR_DEPARTMENT_OTHER): Payer: Self-pay

## 2023-12-15 ENCOUNTER — Encounter: Payer: Self-pay | Admitting: Internal Medicine

## 2023-12-16 ENCOUNTER — Ambulatory Visit

## 2023-12-20 ENCOUNTER — Other Ambulatory Visit (HOSPITAL_BASED_OUTPATIENT_CLINIC_OR_DEPARTMENT_OTHER): Payer: Self-pay

## 2023-12-20 MED ORDER — TRAZODONE HCL 50 MG PO TABS
50.0000 mg | ORAL_TABLET | Freq: Every evening | ORAL | 1 refills | Status: AC | PRN
  Filled 2023-12-20 – 2024-01-18 (×2): qty 30, 30d supply, fill #0

## 2023-12-20 MED ORDER — ESCITALOPRAM OXALATE 10 MG PO TABS
10.0000 mg | ORAL_TABLET | Freq: Every day | ORAL | 1 refills | Status: AC
  Filled 2023-12-20 – 2024-01-18 (×2): qty 30, 30d supply, fill #0

## 2023-12-30 ENCOUNTER — Other Ambulatory Visit (HOSPITAL_BASED_OUTPATIENT_CLINIC_OR_DEPARTMENT_OTHER): Payer: Self-pay

## 2024-01-05 ENCOUNTER — Other Ambulatory Visit (HOSPITAL_COMMUNITY): Payer: Self-pay

## 2024-01-06 ENCOUNTER — Other Ambulatory Visit (HOSPITAL_COMMUNITY): Payer: Self-pay

## 2024-01-06 ENCOUNTER — Encounter: Payer: Self-pay | Admitting: Neurology

## 2024-01-06 ENCOUNTER — Other Ambulatory Visit: Payer: Self-pay

## 2024-01-11 ENCOUNTER — Other Ambulatory Visit: Payer: Self-pay

## 2024-01-18 ENCOUNTER — Other Ambulatory Visit (HOSPITAL_BASED_OUTPATIENT_CLINIC_OR_DEPARTMENT_OTHER): Payer: Self-pay

## 2024-01-28 ENCOUNTER — Other Ambulatory Visit: Payer: Self-pay | Admitting: Neurology

## 2024-01-28 DIAGNOSIS — G932 Benign intracranial hypertension: Secondary | ICD-10-CM

## 2024-02-01 ENCOUNTER — Telehealth: Payer: Self-pay | Admitting: Neurology

## 2024-02-01 NOTE — Telephone Encounter (Addendum)
 Referral for ophthalmology fax and sent through [facility] to [facility] to see Dr. Arlena Reusing. Phone: [phone], Fax: [phone]

## 2024-02-20 ENCOUNTER — Telehealth

## 2024-05-29 ENCOUNTER — Ambulatory Visit: Admitting: Neurology

## 2024-10-26 ENCOUNTER — Encounter (HOSPITAL_BASED_OUTPATIENT_CLINIC_OR_DEPARTMENT_OTHER): Admitting: Certified Nurse Midwife
